# Patient Record
Sex: Female | Born: 1945 | Race: White | Hispanic: No | Smoking: Never smoker
Health system: Southern US, Community
[De-identification: ages and names within clinical notes are randomized; demographics above are authoritative.]

## PROBLEM LIST (undated history)

## (undated) ENCOUNTER — Inpatient Hospital Stay: Payer: Self-pay | Admitting: Diagnostic Radiology

## (undated) DIAGNOSIS — Z9289 Personal history of other medical treatment: Secondary | ICD-10-CM

## (undated) DIAGNOSIS — N281 Cyst of kidney, acquired: Secondary | ICD-10-CM

## (undated) DIAGNOSIS — H409 Unspecified glaucoma: Secondary | ICD-10-CM

## (undated) DIAGNOSIS — I10 Essential (primary) hypertension: Secondary | ICD-10-CM

## (undated) DIAGNOSIS — G4730 Sleep apnea, unspecified: Secondary | ICD-10-CM

## (undated) DIAGNOSIS — M4800 Spinal stenosis, site unspecified: Secondary | ICD-10-CM

## (undated) DIAGNOSIS — M722 Plantar fascial fibromatosis: Secondary | ICD-10-CM

## (undated) DIAGNOSIS — M797 Fibromyalgia: Secondary | ICD-10-CM

## (undated) DIAGNOSIS — M419 Scoliosis, unspecified: Secondary | ICD-10-CM

## (undated) DIAGNOSIS — E7800 Pure hypercholesterolemia, unspecified: Secondary | ICD-10-CM

## (undated) DIAGNOSIS — M1990 Unspecified osteoarthritis, unspecified site: Secondary | ICD-10-CM

## (undated) DIAGNOSIS — C801 Malignant (primary) neoplasm, unspecified: Secondary | ICD-10-CM

## (undated) HISTORY — PX: CHOLECYSTECTOMY: SHX55

## (undated) HISTORY — PX: CATARACT EXTRACTION: SUR2

## (undated) HISTORY — PX: FINGER SURGERY: SHX640

## (undated) HISTORY — PX: EYE SURGERY: SHX253

## (undated) HISTORY — PX: TUBAL LIGATION: SHX77

## (undated) HISTORY — PX: TONSILLECTOMY: SUR1361

## (undated) HISTORY — PX: BACK SURGERY: SHX140

## (undated) HISTORY — PX: ABDOMINAL HYSTERECTOMY: SHX81

## (undated) HISTORY — PX: ANKLE FRACTURE SURGERY: SHX122

---

## 2007-04-10 ENCOUNTER — Inpatient Hospital Stay (HOSPITAL_COMMUNITY): Admission: RE | Admit: 2007-04-10 | Discharge: 2007-04-14 | Payer: Self-pay | Admitting: Orthopaedic Surgery

## 2008-11-28 ENCOUNTER — Encounter: Admission: RE | Admit: 2008-11-28 | Discharge: 2008-11-28 | Payer: Self-pay | Admitting: Orthopaedic Surgery

## 2010-10-26 ENCOUNTER — Encounter
Admission: RE | Admit: 2010-10-26 | Discharge: 2010-10-26 | Payer: Self-pay | Source: Home / Self Care | Attending: Orthopaedic Surgery | Admitting: Orthopaedic Surgery

## 2010-11-08 HISTORY — PX: ANKLE FRACTURE SURGERY: SHX122

## 2010-11-13 ENCOUNTER — Inpatient Hospital Stay (HOSPITAL_COMMUNITY)
Admission: RE | Admit: 2010-11-13 | Discharge: 2010-11-16 | Payer: Self-pay | Source: Home / Self Care | Attending: Orthopaedic Surgery | Admitting: Orthopaedic Surgery

## 2010-11-23 LAB — BASIC METABOLIC PANEL
BUN: 7 mg/dL (ref 6–23)
CO2: 26 mEq/L (ref 19–32)
Calcium: 7.9 mg/dL — ABNORMAL LOW (ref 8.4–10.5)
Chloride: 103 mEq/L (ref 96–112)
Creatinine, Ser: 0.9 mg/dL (ref 0.4–1.2)
GFR calc Af Amer: >60 mL/min (ref >60)
GFR calc non Af Amer: >60 mL/min (ref >60)
Glucose, Bld: 159 mg/dL — ABNORMAL HIGH (ref 70–99)
Potassium: 4.2 mEq/L (ref 3.5–5.1)
Sodium: 135 mEq/L (ref 135–145)

## 2010-11-23 LAB — HEMOGLOBIN AND HEMATOCRIT, BLOOD
HCT: 25.3 % — ABNORMAL LOW (ref 36.0–46.0)
HCT: 25.1 % — ABNORMAL LOW (ref 36.0–46.0)
Hemoglobin: 8.4 g/dL — ABNORMAL LOW (ref 12.0–15.0)
Hemoglobin: 8.3 g/dL — ABNORMAL LOW (ref 12.0–15.0)

## 2011-01-18 LAB — DIFFERENTIAL
Basophils Absolute: 0 10*3/uL (ref 0.0–0.1)
Basophils Relative: 1 % (ref 0–1)
Eosinophils Absolute: 0.3 10*3/uL (ref 0.0–0.7)
Eosinophils Relative: 5 % (ref 0–5)
Lymphocytes Relative: 23 % (ref 12–46)
Lymphs Abs: 1.2 10*3/uL (ref 0.7–4.0)
Monocytes Absolute: 0.5 10*3/uL (ref 0.1–1.0)
Monocytes Relative: 10 % (ref 3–12)
Neutro Abs: 3.2 10*3/uL (ref 1.7–7.7)
Neutrophils Relative %: 61 % (ref 43–77)

## 2011-01-18 LAB — CBC
HCT: 39.1 % (ref 36.0–46.0)
Hemoglobin: 13 g/dL (ref 12.0–15.0)
MCH: 28 pg (ref 26.0–34.0)
MCHC: 33.2 g/dL (ref 30.0–36.0)
MCV: 84.1 fL (ref 78.0–100.0)
Platelets: 205 10*3/uL (ref 150–400)
RBC: 4.65 MIL/uL (ref 3.87–5.11)
RDW: 13.1 % (ref 11.5–15.5)
WBC: 5.2 10*3/uL (ref 4.0–10.5)

## 2011-01-18 LAB — COMPREHENSIVE METABOLIC PANEL
ALT: 15 U/L (ref 0–35)
AST: 25 U/L (ref 0–37)
Albumin: 3.7 g/dL (ref 3.5–5.2)
Alkaline Phosphatase: 69 U/L (ref 39–117)
BUN: 9 mg/dL (ref 6–23)
CO2: 29 mEq/L (ref 19–32)
Calcium: 9.5 mg/dL (ref 8.4–10.5)
Chloride: 103 mEq/L (ref 96–112)
Creatinine, Ser: 0.94 mg/dL (ref 0.4–1.2)
GFR calc Af Amer: >60 mL/min (ref >60)
GFR calc non Af Amer: 60 mL/min — ABNORMAL LOW (ref >60)
Glucose, Bld: 80 mg/dL (ref 70–99)
Potassium: 3.7 mEq/L (ref 3.5–5.1)
Sodium: 139 mEq/L (ref 135–145)
Total Bilirubin: 0.7 mg/dL (ref 0.3–1.2)
Total Protein: 6.3 g/dL (ref 6.0–8.3)

## 2011-01-18 LAB — URINALYSIS, ROUTINE W REFLEX MICROSCOPIC
Bilirubin Urine: NEGATIVE
Glucose, UA: NEGATIVE mg/dL
Hgb urine dipstick: NEGATIVE
Ketones, ur: NEGATIVE mg/dL
Nitrite: NEGATIVE
Protein, ur: NEGATIVE mg/dL
Specific Gravity, Urine: 1.008 (ref 1.005–1.030)
Urobilinogen, UA: 0.2 mg/dL (ref 0.0–1.0)
pH: 6 (ref 5.0–8.0)

## 2011-01-18 LAB — SURGICAL PCR SCREEN
MRSA, PCR: NEGATIVE
Staphylococcus aureus: NEGATIVE

## 2011-01-18 LAB — TYPE AND SCREEN
ABO/RH(D): O POS
Antibody Screen: NEGATIVE

## 2011-01-18 LAB — PROTIME-INR
INR: 0.92 (ref 0.00–1.49)
Prothrombin Time: 12.6 seconds (ref 11.6–15.2)

## 2011-01-18 LAB — URINE MICROSCOPIC-ADD ON

## 2011-03-23 NOTE — Op Note (Signed)
Joanna Castro, Joanna Castro              ACCOUNT NO.:  [id number]   MEDICAL RECORD NO.:  [id number]          PATIENT TYPE:  INP   LOCATION:  5021                         FACILITY:  [facility]   PHYSICIAN:  Mark C. Ophelia Charter, M.D.    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   PROCEDURE:  L4-5 decompression and 360 fusion.  PEEK cage packed with  autograft bone.  Bilateral gutter fusion with Vitoss plus local bone.  Right transverse lumbar interbody fusion with autograft interbody bone  plus cage.  Polaris [employer]omet pedicle instrumentation, 6.5 screws, 45 mm  rod.   SURGEON:  Annell Greening, MD   ASSISTANT:  Maud Deed, PA-C   ANESTHESIA:  GOT.   ESTIMATED BLOOD LOSS:  900 plus 300 mL Cell Saver retransfusion.   DRAINS:  One Hemovac subcu.   INDICATION:  This 65 year old female has had progressive back pain and  instability with MRI findings of moderate stenosis, anterolisthesis,  degenerative and single-level disease.  She is admitted for  decompression and stabilization for instability.   PROCEDURE:  After induction of general anesthesia, the patient was  placed prone on chest rolls with careful padding and positioning, arms  on arm boards, reversed Trendelenburg to take pressure off thighs.  A  time-out procedure was taken and the area was squared off with towels;  Betadine, Vi-Drape, sterile Steri-Drape and laminectomy drapes were  applied.  Localization was performed after incision was made in  subcutaneous tissue and a Kocher clamp was placed over the expected L4-5  disk space.  Cross-table lateral C-arm confirmed this, which was  sterilely draped and placed underneath the patient, which was on the  flat Murray table with her chest rolls.  Decompression was performed;  the operative  microscope was used.  Once decompression was performed  following out over the facets out to the transverse processes, all  transverse processes were identified.  The upper one on the right at L4  had the tip of it cracked off, which was the lateral one-third.  Pedicle  screws were placed after decompression removing the facets using  osteotomes and curettes.  Once all 4 pedicle screws were placed,  checking AP and lateral fluoroscopy with good position, the diskectomy  was performed on the right side with removal using box cutters, the  chisels, up and down curettes, Epstein shaved curettes, straight  curettes, small, medium and large pituitaries, straight and upbiting.  Large pieces of bone from the facet that had been removed was packed  after being custom-fashioned and impacted in the anterior portion of the  disk space anterior 1/3, next, trial sizers of 7- and 9-mm trials with  good fit with the 9.  Nine-millimeter cage was packed with chunked-up  pieces of local bone impacted into position.  The cage was slightly  vertical and did not completely shift over, but was in the midline, but  did not completely rotate.  Distraction was used between the pedicle  screws and continued impaction and a cage was secured with 2-3 mm  underneath the endplates.  The anterolisthesis had been reduced, but the  cage would not advance any further due to the large chunks of the  autograft bone that was anterior to it.  Since the cage was secure, it  could not be pulled back and it was left in that position.  Decortications on the transverse processes were performed, rods were  placed, tightened down on the right TLIF side first over the cage and  then on the opposite side, compressing.  Final spot fluoro pictures were  taken and bone, which was autograft and small pieces placed and rolled  up in the Vitoss like a Taco, was placed down the transverse process and  additional pieces of small local bone  were packed over the top.  No bone  was left in the canal.  During the procedure, some epidural veins were  coagulated.  There was more blood loss from the endplates after using  the box chisel and curettes then usual.  Some FloSeal as well as  thrombin-soaked Gelfoam was used intermittently for this.  Blood loss  was 900 mL with retransfusion of 300 mL.  Final spot pictures showed  good position.  Fascia was closed after copious irrigation with 0  Vicryl, 2-0 Vicryl in subcutaneous tissue, skin staple closure, Marcaine  infiltration in the skin and postop dressing.  Instrument count and  needle count was correct.      Mark C. Ophelia Charter, M.D.  Electronically Signed     MCY/MEDQ  D:  04/10/2007  T:  04/11/2007  Job:  [id number]

## 2011-08-26 LAB — BASIC METABOLIC PANEL
BUN: 18
CO2: 27
Calcium: 7.4 — ABNORMAL LOW
Chloride: 106
Creatinine, Ser: 1.11
GFR calc Af Amer: >60
GFR calc non Af Amer: 50 — ABNORMAL LOW
Glucose, Bld: 120 — ABNORMAL HIGH
Potassium: 3.4 — ABNORMAL LOW
Sodium: 136

## 2011-08-26 LAB — HEMOGLOBIN AND HEMATOCRIT, BLOOD
HCT: 29.3 — ABNORMAL LOW
HCT: 31.3 — ABNORMAL LOW
Hemoglobin: 9.9 — ABNORMAL LOW

## 2011-08-26 LAB — PREPARE RBC (CROSSMATCH)

## 2012-11-20 ENCOUNTER — Emergency Department (HOSPITAL_BASED_OUTPATIENT_CLINIC_OR_DEPARTMENT_OTHER): Payer: Medicare Other

## 2012-11-20 ENCOUNTER — Emergency Department (HOSPITAL_BASED_OUTPATIENT_CLINIC_OR_DEPARTMENT_OTHER)
Admission: EM | Admit: 2012-11-20 | Discharge: 2012-11-20 | Disposition: A | Discharge status: Home / Self Care | Payer: Medicare Other | Attending: Emergency Medicine | Admitting: Emergency Medicine

## 2012-11-20 ENCOUNTER — Encounter (HOSPITAL_BASED_OUTPATIENT_CLINIC_OR_DEPARTMENT_OTHER): Payer: Self-pay | Admitting: Emergency Medicine

## 2012-11-20 DIAGNOSIS — W19XXXA Unspecified fall, initial encounter: Secondary | ICD-10-CM

## 2012-11-20 DIAGNOSIS — S0101XA Laceration without foreign body of scalp, initial encounter: Secondary | ICD-10-CM

## 2012-11-20 DIAGNOSIS — Y9289 Other specified places as the place of occurrence of the external cause: Secondary | ICD-10-CM | POA: Insufficient documentation

## 2012-11-20 DIAGNOSIS — W010XXA Fall on same level from slipping, tripping and stumbling without subsequent striking against object, initial encounter: Secondary | ICD-10-CM | POA: Insufficient documentation

## 2012-11-20 DIAGNOSIS — Y9301 Activity, walking, marching and hiking: Secondary | ICD-10-CM | POA: Insufficient documentation

## 2012-11-20 DIAGNOSIS — S0100XA Unspecified open wound of scalp, initial encounter: Secondary | ICD-10-CM | POA: Insufficient documentation

## 2012-11-20 HISTORY — DX: Unspecified glaucoma: H40.9

## 2012-11-20 HISTORY — DX: Essential (primary) hypertension: I10

## 2012-11-20 HISTORY — DX: Spinal stenosis, site unspecified: M48.00

## 2012-11-20 HISTORY — DX: Scoliosis, unspecified: M41.9

## 2012-11-20 HISTORY — DX: Pure hypercholesterolemia, unspecified: E78.00

## 2012-11-20 HISTORY — DX: Unspecified osteoarthritis, unspecified site: M19.90

## 2012-11-20 MED ORDER — LIDOCAINE-EPINEPHRINE 2 %-1:100000 IJ SOLN
INTRAMUSCULAR | Status: AC
  Filled 2012-11-20: qty 1

## 2012-11-20 NOTE — ED Notes (Signed)
MD at bedside. 

## 2012-11-20 NOTE — ED Provider Notes (Signed)
History     CSN: [id number]  Arrival date & time 11/20/12  1652   First MD Initiated Contact with Patient 11/20/12 1704      Chief Complaint  Patient presents with  . Fall  . Head Injury    (Consider location/radiation/quality/duration/timing/severity/associated sxs/prior treatment) HPI Comments: Patient had a mechanical fall on the parking lot. Patient tripped over her cane, which she uses for some chronic leg pain. She reports no chest pain, shortness of breath, dizziness, nausea or vomiting prior to the fall. She hit her head and it bounced 3 times a concrete. No loss of consciousness. Ambulatory after the event. She is not on any anticoagulants, however takes NSAIDs daily  Patient is a 67 y.o. female presenting with fall and head injury. The history is provided by the patient.  Fall The accident occurred less than 1 hour ago. The fall occurred while walking. She fell from a height of 3 to 5 ft. She landed on concrete. The volume of blood lost was minimal. The point of impact was the head. The pain is present in the head (Posterior pelvis). The pain is at a severity of 3/10. The pain is mild. She was ambulatory at the scene. There was no drug use involved in the accident. There was no alcohol use involved in the accident. Pertinent negatives include no fever, no abdominal pain, no vomiting, no hearing loss and no loss of consciousness. She has tried nothing for the symptoms.  Head Injury  Pertinent negatives include no vomiting.    No past medical history on file.  Past Surgical History  Procedure Date  . Back surgery   . Ankle fracture surgery     No family history on file.  History  Substance Use Topics  . Smoking status: Not on file  . Smokeless tobacco: Not on file  . Alcohol Use:     OB History    Grav Para Term Preterm Abortions TAB SAB Ect Mult Living                  Review of Systems  Constitutional: Negative for fever and chills.  Respiratory: Negative  for cough and shortness of breath.   Gastrointestinal: Negative for vomiting and abdominal pain.  Neurological: Negative for loss of consciousness.  All other systems reviewed and are negative.    Allergies  Review of patient's allergies indicates no known allergies.  Home Medications  No current outpatient prescriptions on file.  BP 145/76  Pulse 88  Temp 98.6 F (37 C) (Oral)  Resp 16  Ht 5\' 5"  (1.651 m)  Wt 195 lb (88.451 kg)  BMI 32.45 kg/m2  SpO2 99%  Physical Exam  Nursing note and vitals reviewed. Constitutional: She is oriented to person, place, and time. She appears well-developed and well-nourished. No distress.  HENT:  Head: Normocephalic.    Eyes: EOM are normal. Pupils are equal, round, and reactive to light.  Neck: Normal range of motion. Neck supple.  Cardiovascular: Normal rate and regular rhythm.  Exam reveals no friction rub.   No murmur heard. Pulmonary/Chest: Effort normal and breath sounds normal. No respiratory distress. She has no wheezes. She has no rales.  Abdominal: Soft. She exhibits no distension. There is no tenderness. There is no rebound.  Musculoskeletal: Normal range of motion. She exhibits no edema.       Cervical back: She exhibits normal range of motion, no tenderness and no bony tenderness.       Lumbar back:  She exhibits bony tenderness (Sacral).       Back:  Neurological: She is alert and oriented to person, place, and time.  Skin: She is not diaphoretic.    ED Course  LACERATION REPAIR Date/Time: 11/20/2012 6:40 PM Performed by: Elwin Mocha Authorized by: Osvaldo Human Consent: Verbal consent obtained. Body area: head/neck Location details: scalp Laceration length: 3 cm Foreign bodies: no foreign bodies Tendon involvement: none Nerve involvement: none Vascular damage: no Anesthesia: local infiltration Local anesthetic: lidocaine 2% with epinephrine Anesthetic total: 4 ml Patient sedated: no Preparation:  Patient was prepped and draped in the usual sterile fashion. Irrigation solution: saline Irrigation method: jet lavage Amount of cleaning: standard Debridement: none Degree of undermining: none Skin closure: staples Number of sutures: 4 Technique: simple Approximation: close Approximation difficulty: simple Patient tolerance: Patient tolerated the procedure well with no immediate complications.   (including critical care time)  Labs Reviewed - No data to display Dg Lumbar Spine Complete  11/20/2012  *RADIOLOGY REPORT*  Clinical Data: Low back pain post fall, multiple prior back surgeries  LUMBAR SPINE - COMPLETE 4+ VIEW  Comparison: CT lumbar spine 10/26/2010, 11/28/2008  Findings: CT exams labeled with last independent lumbar vertebra designated L5. Osseous demineralization. Prior posterior fusion of L3-L5 with intact bilateral pedicle screws and bars. Disc space narrowing with endplate spur formation L2-L3. Vertebral body heights maintained without fracture or subluxation. Prior laminectomy L4. Broad-based dextroconvex thoracolumbar scoliosis. SI joints preserved. No definite bone destruction or spondylolysis identified.  IMPRESSION: Post L3-L5 posterior fusion. Degenerative disc disease changes L2-L3. Osseous demineralization with dextroconvex scoliosis. No definite acute bony abnormalities.   Original Report Authenticated By: Ulyses Southward, M.D.    Dg Sacrum/coccyx  11/20/2012  *RADIOLOGY REPORT*  Clinical Data: Low back pain post fall, multiple prior back surgeries  SACRUM AND COCCYX - 2+ VIEW  Comparison: None  Findings: Orthopedic hardware at L4-L5. SI joints symmetric and preserved. Bones are demineralized, limiting exam. Sacral foramina appear grossly symmetric. Angular deformity at the distal sacrum. Cortex is difficult to evaluate due to degree of osseous demineralization. This area is not well visualized on the scout images from the prior CT exams and no adequate prior radiographs of  this site are available. Cannot exclude a distal sacral fracture. No other potential bony abnormalities identified.  IMPRESSION: Cannot exclude a distal sacral fracture; if the patient has symptoms referable to this site recommend noncontrast CT pelvis for further evaluation.   Original Report Authenticated By: Ulyses Southward, M.D.    Ct Head Wo Contrast  11/20/2012  *RADIOLOGY REPORT*  Clinical Data: Fall striking posterior head on concrete, laceration, no loss of consciousness  CT HEAD WITHOUT CONTRAST  Technique:  Contiguous axial images were obtained from the base of the skull through the vertex without contrast.  Comparison: None  Findings: Generalized atrophy. Normal ventricular morphology. No midline shift or mass effect. Minimal periventricular white matter lucency question small vessel chronic ischemic change. No intracranial hemorrhage, mass lesion or evidence of acute infarction. No extra-axial fluid collections. Small scalp hematoma at posterior vertex. Bones and sinuses unremarkable.  IMPRESSION: Atrophy with minimal small vessel chronic ischemic changes of periventricular white matter. No acute intracranial abnormalities.   Original Report Authenticated By: Ulyses Southward, M.D.      1. Fall   2. Scalp laceration       MDM  67 year old female with history of multiple spinal surgeries presents after a mechanical fall. She tripped over her cane in the parking lot. Denies any  preceding symptoms. Her head bounced 3 times in the concrete. She is a midthoracic of the event and denies any loss of consciousness. There is a small lac on her posterior scalp. She has mild sacral tenderness, she states is chronic, however worse after the fall. She has no extremity deformity. She is ambulatory and can walk well. She has no pain with ROM of her hips.  I will scan her Head. I do not feel like she requires a CT of her pelvis, however I will get xrays of her L spine and sacrum/coccyx. CT of her head is negative.  X-rays of her whole spine and coccyx are normal except they could not rule out distal coccygeal injury. I discussed this with patient is comfortable with not pursuing CT scan. She is ambulatory around the room with her cane, as she is at baseline. Lac repair as above. Stable for discharge.        Elwin Mocha, MD 11/21/12 [phone]

## 2012-11-20 NOTE — ED Notes (Signed)
Pt to room 10 by ems, pt able to stand and walk to stretcher, reports trip and fall when she was walking across the parking lot of her doctor's office. Hematoma and small amt of blood noted to posterior scalp, no active bleeding noted.

## 2012-11-21 NOTE — ED Provider Notes (Signed)
I reviewed the resident's note and I agree with the findings and plan.   Carleene Cooper III, MD 11/21/12 [phone]

## 2014-04-08 ENCOUNTER — Other Ambulatory Visit (HOSPITAL_COMMUNITY): Payer: Self-pay | Admitting: Orthopaedic Surgery

## 2014-04-18 ENCOUNTER — Encounter (HOSPITAL_COMMUNITY): Payer: Self-pay | Admitting: Pharmacy Technician

## 2014-04-22 ENCOUNTER — Encounter (HOSPITAL_COMMUNITY): Payer: Self-pay

## 2014-04-22 ENCOUNTER — Encounter (HOSPITAL_COMMUNITY)
Admission: RE | Admit: 2014-04-22 | Discharge: 2014-04-22 | Disposition: A | Discharge status: Home / Self Care | Payer: Medicare HMO | Source: Ambulatory Visit | Attending: Orthopaedic Surgery | Admitting: Orthopaedic Surgery

## 2014-04-22 ENCOUNTER — Ambulatory Visit (HOSPITAL_COMMUNITY)
Admission: RE | Admit: 2014-04-22 | Discharge: 2014-04-22 | Disposition: A | Discharge status: Home / Self Care | Payer: Medicare HMO | Source: Ambulatory Visit | Attending: Orthopaedic Surgery | Admitting: Orthopaedic Surgery

## 2014-04-22 DIAGNOSIS — Z01818 Encounter for other preprocedural examination: Secondary | ICD-10-CM | POA: Insufficient documentation

## 2014-04-22 DIAGNOSIS — Z01812 Encounter for preprocedural laboratory examination: Secondary | ICD-10-CM | POA: Insufficient documentation

## 2014-04-22 HISTORY — DX: Sleep apnea, unspecified: G47.30

## 2014-04-22 HISTORY — DX: Personal history of other medical treatment: Z92.89

## 2014-04-22 HISTORY — DX: Cyst of kidney, acquired: N28.1

## 2014-04-22 LAB — URINE MICROSCOPIC-ADD ON

## 2014-04-22 LAB — CBC
HEMATOCRIT: 37.4 % (ref 36.0–46.0)
HEMOGLOBIN: 12.1 g/dL (ref 12.0–15.0)
MCH: 27.2 pg (ref 26.0–34.0)
MCHC: 32.4 g/dL (ref 30.0–36.0)
MCV: 84 fL (ref 78.0–100.0)
Platelets: 341 10*3/uL (ref 150–400)
RBC: 4.45 MIL/uL (ref 3.87–5.11)
RDW: 13.1 % (ref 11.5–15.5)
WBC: 8.7 10*3/uL (ref 4.0–10.5)

## 2014-04-22 LAB — PROTIME-INR
INR: 1.06 (ref 0.00–1.49)
PROTHROMBIN TIME: 13.6 s (ref 11.6–15.2)

## 2014-04-22 LAB — COMPREHENSIVE METABOLIC PANEL
ALBUMIN: 3.8 g/dL (ref 3.5–5.2)
ALK PHOS: 87 U/L (ref 39–117)
ALT: 15 U/L (ref 0–35)
AST: 22 U/L (ref 0–37)
BILIRUBIN TOTAL: 0.4 mg/dL (ref 0.3–1.2)
BUN: 15 mg/dL (ref 6–23)
CHLORIDE: 105 meq/L (ref 96–112)
CO2: 25 mEq/L (ref 19–32)
CREATININE: 0.91 mg/dL (ref 0.50–1.10)
Calcium: 9.7 mg/dL (ref 8.4–10.5)
GFR calc non Af Amer: 63 mL/min — ABNORMAL LOW (ref >90)
GFR, EST AFRICAN AMERICAN: 73 mL/min — AB (ref >90)
GLUCOSE: 98 mg/dL (ref 70–99)
POTASSIUM: 4 meq/L (ref 3.7–5.3)
Sodium: 142 mEq/L (ref 137–147)
Total Protein: 7.1 g/dL (ref 6.0–8.3)

## 2014-04-22 LAB — URINALYSIS, ROUTINE W REFLEX MICROSCOPIC
BILIRUBIN URINE: NEGATIVE
Glucose, UA: NEGATIVE mg/dL
HGB URINE DIPSTICK: NEGATIVE
Ketones, ur: NEGATIVE mg/dL
NITRITE: NEGATIVE
PROTEIN: NEGATIVE mg/dL
SPECIFIC GRAVITY, URINE: 1.019 (ref 1.005–1.030)
UROBILINOGEN UA: 1 mg/dL (ref 0.0–1.0)
pH: 6 (ref 5.0–8.0)

## 2014-04-22 LAB — SURGICAL PCR SCREEN
MRSA, PCR: NEGATIVE
Staphylococcus aureus: NEGATIVE

## 2014-04-22 LAB — TYPE AND SCREEN
ABO/RH(D): O POS
Antibody Screen: NEGATIVE

## 2014-04-22 NOTE — Pre-Procedure Instructions (Signed)
Joanna Castro  04/22/2014   Your procedure is scheduled on:  Monday, June 22nd  [facility] at 1030 AM.  Call this number if you have problems the morning of surgery: [phone]   Remember:   Do not eat food or drink liquids after midnight.   Take these medicines the morning of surgery with A SIP OF WATER: neurontin, norvasc, ultram if needed, eye drops, zyrtec  Stop taking OTC vitamins, NSAIDS (ibuprofen, naproxen) as of today   Do not wear jewelry, make-up or nail polish.  Do not wear lotions, powders, or perfumes. You may wear deodorant.  Do not shave 48 hours prior to surgery. Men may shave face and neck.  Do not bring valuables to the hospital.  [facility] is not responsible  for any belongings or valuables.               Contacts, dentures or bridgework may not be worn into surgery.  Leave suitcase in the car. After surgery it may be brought to your room.  For patients admitted to the hospital, discharge time is determined by your  treatment team.             Please read over the following fact sheets that you were given: Pain Booklet, Coughing and Deep Breathing, Blood Transfusion Information, MRSA Information and Surgical Site Infection Prevention [place] - Preparing for Surgery  Before surgery, you can play an important role.  Because skin is not sterile, your skin needs to be as free of germs as possible.  You can reduce the number of germs on you skin by washing with CHG (chlorahexidine gluconate) soap before surgery.  CHG is an antiseptic cleaner which kills germs and bonds with the skin to continue killing germs even after washing.  Please DO NOT use if you have an allergy to CHG or antibacterial soaps.  If your skin becomes reddened/irritated stop using the CHG and inform your nurse when you arrive at Short Stay.  Do not shave (including legs and underarms) for at least 48 hours prior to the first CHG shower.  You may shave your  face.  Please follow these instructions carefully:   1.  Shower with CHG Soap the night before surgery and the morning of Surgery.  2.  If you choose to wash your hair, wash your hair first as usual with your normal shampoo.  3.  After you shampoo, rinse your hair and body thoroughly to remove the shampoo.  4.  Use CHG as you would any other liquid soap.  You can apply CHG directly to the skin and wash gently with scrungie or a clean washcloth.  5.  Apply the CHG Soap to your body ONLY FROM THE NECK DOWN.  Do not use on open wounds or open sores.  Avoid contact with your eyes, ears, mouth and genitals (private parts).  Wash genitals (private parts) with your normal soap.  6.  Wash thoroughly, paying special attention to the area where your surgery will be performed.  7.  Thoroughly rinse your body with warm water from the neck down.  8.  DO NOT shower/wash with your normal soap after using and rinsing off the CHG Soap.  9.  Pat yourself dry with a clean towel.            10.  Wear clean pajamas.            11.  Place clean sheets on your  bed the night of your first shower and do not sleep with pets.  Day of Surgery  Do not apply any lotions/deoderants the morning of surgery.  Please wear clean clothes to the hospital/surgery center.

## 2014-04-22 NOTE — Progress Notes (Signed)
Primary - dr Bing [facility] ([facility]) [facility] cardi[facility] - will request

## 2014-04-26 NOTE — H&P (Signed)
TOTAL KNEE ADMISSION H&P  Patient is being admitted for right total knee arthroplasty.  Subjective:  Chief Complaint:right knee pain.  HPI: Joanna Castro, 68 y.o. female, has a history of pain and functional disability in the right knee due to arthritis and has failed non-surgical conservative treatments for greater than 12 weeks to includeNSAID's and/or analgesics, corticosteriod injections and use of assistive devices.  Onset of symptoms was gradual, starting 3 years ago with gradually worsening course since that time. The patient noted no past surgery on the right knee(s).  Patient currently rates pain in the right knee(s) at 6 out of 10 with activity. Patient has worsening of pain with activity and weight bearing, pain that interferes with activities of daily living and joint swelling.  Patient has evidence of subchondral cysts, subchondral sclerosis, periarticular osteophytes and joint space narrowing by imaging studies.  There is no active infection.  There are no active problems to display for this patient.  Past Medical History  Diagnosis Date  . Spinal stenosis   . Scoliosis   . Hypertension   . Arthritis   . High cholesterol   . Glaucoma   . Sleep apnea     intermittently uses cpap ( started 2 weeks ago)  . Kidney cysts     effecting creat.  Marland Kitchen. Headache(784.0)   . History of blood transfusion     Past Surgical History  Procedure Laterality Date  . Back surgery    . Ankle fracture surgery    . Cholecystectomy    . Abdominal hysterectomy    . Finger surgery    . Eye surgery Bilateral     placed duct tubes    No prescriptions prior to admission   No Known Allergies  History  Substance Use Topics  . Smoking status: Never Smoker   . Smokeless tobacco: Not on file  . Alcohol Use: Yes     Comment: rare    No family history on file.   Review of Systems  Musculoskeletal: Positive for joint pain.       Right knee pain and dysfunction    Objective:  Physical  Exam  Constitutional: She is oriented to person, place, and time. She appears well-developed and well-nourished.  HENT:  Head: Normocephalic and atraumatic.  Eyes: EOM are normal. Pupils are equal, round, and reactive to light.  Neck: Normal range of motion.  Cardiovascular: Normal rate.   Respiratory: Effort normal.  GI: Soft.  Musculoskeletal:  ROM 0-110 degrees.  Valgus deformity.  Lateral joint line tenderness.  Neurological: She is alert and oriented to person, place, and time.  Skin: Skin is warm and dry.  Psychiatric: She has a normal mood and affect.    Vital signs in last 24 hours:    Labs:   Estimated body mass index is 32.45 kg/(m^2) as calculated from the following:   Height as of 11/20/12: 5\' 5"  (1.651 m).   Weight as of 11/20/12: 88.451 kg (195 lb).   Imaging Review Plain radiographs demonstrate moderate degenerative joint disease of the right knee(s). The overall alignment ismild valgus. The bone quality appears to be adequate for age and reported activity level.  Assessment/Plan:  End stage arthritis, right knee   The patient history, physical examination, clinical judgment of the provider and imaging studies are consistent with end stage degenerative joint disease of the right knee(s) and total knee arthroplasty is deemed medically necessary. The treatment options including medical management, injection therapy arthroscopy and arthroplasty were discussed  at length. The risks and benefits of total knee arthroplasty were presented and reviewed. The risks due to aseptic loosening, infection, stiffness, patella tracking problems, thromboembolic complications and other imponderables were discussed. The patient acknowledged the explanation, agreed to proceed with the plan and consent was signed. Patient is being admitted for inpatient treatment for surgery, pain control, PT, OT, prophylactic antibiotics, VTE prophylaxis, progressive ambulation and ADL's and discharge  planning. The patient is planning to be discharged home with home health services

## 2014-04-28 MED ORDER — CEFAZOLIN SODIUM-DEXTROSE 2-3 GM-% IV SOLR
2.0000 g | INTRAVENOUS | Status: AC
  Administered 2014-04-29: 2 g via INTRAVENOUS
  Filled 2014-04-28: qty 50

## 2014-04-29 ENCOUNTER — Encounter (HOSPITAL_COMMUNITY): Payer: Medicare HMO | Admitting: Anesthesiology

## 2014-04-29 ENCOUNTER — Encounter (HOSPITAL_COMMUNITY)
Admission: RE | Disposition: A | Discharge status: Home Health | Payer: Self-pay | Source: Ambulatory Visit | Attending: Orthopaedic Surgery

## 2014-04-29 ENCOUNTER — Inpatient Hospital Stay (HOSPITAL_COMMUNITY): Payer: Medicare HMO | Admitting: Anesthesiology

## 2014-04-29 ENCOUNTER — Inpatient Hospital Stay (HOSPITAL_COMMUNITY)
Admission: RE | Admit: 2014-04-29 | Discharge: 2014-05-01 | DRG: 470 | Disposition: A | Discharge status: Home Health | Payer: Medicare HMO | Source: Ambulatory Visit | Attending: Orthopaedic Surgery | Admitting: Orthopaedic Surgery

## 2014-04-29 ENCOUNTER — Encounter (HOSPITAL_COMMUNITY): Payer: Self-pay | Admitting: Anesthesiology

## 2014-04-29 DIAGNOSIS — M1711 Unilateral primary osteoarthritis, right knee: Secondary | ICD-10-CM | POA: Diagnosis present

## 2014-04-29 DIAGNOSIS — M1710 Unilateral primary osteoarthritis, unspecified knee: Principal | ICD-10-CM | POA: Diagnosis present

## 2014-04-29 DIAGNOSIS — M4120 Other idiopathic scoliosis, site unspecified: Secondary | ICD-10-CM | POA: Diagnosis present

## 2014-04-29 DIAGNOSIS — E7800 Pure hypercholesterolemia, unspecified: Secondary | ICD-10-CM | POA: Diagnosis present

## 2014-04-29 DIAGNOSIS — I10 Essential (primary) hypertension: Secondary | ICD-10-CM | POA: Diagnosis present

## 2014-04-29 DIAGNOSIS — G4730 Sleep apnea, unspecified: Secondary | ICD-10-CM | POA: Diagnosis present

## 2014-04-29 HISTORY — PX: KNEE ARTHROPLASTY: SHX992

## 2014-04-29 SURGERY — ARTHROPLASTY, KNEE, TOTAL, USING IMAGELESS COMPUTER-ASSISTED NAVIGATION
Anesthesia: Regional | Site: Knee | Laterality: Right

## 2014-04-29 MED ORDER — TIMOLOL MALEATE 0.5 % OP SOLN
1.0000 [drp] | Freq: Every day | OPHTHALMIC | Status: DC
  Filled 2014-04-29 (×2): qty 5

## 2014-04-29 MED ORDER — FENTANYL CITRATE 0.05 MG/ML IJ SOLN
INTRAMUSCULAR | Status: AC
  Filled 2014-04-29: qty 5

## 2014-04-29 MED ORDER — PHENYLEPHRINE 40 MCG/ML (10ML) SYRINGE FOR IV PUSH (FOR BLOOD PRESSURE SUPPORT)
PREFILLED_SYRINGE | INTRAVENOUS | Status: AC
  Filled 2014-04-29: qty 10

## 2014-04-29 MED ORDER — PHENYLEPHRINE HCL 10 MG/ML IJ SOLN
INTRAMUSCULAR | Status: AC
  Filled 2014-04-29: qty 1

## 2014-04-29 MED ORDER — HYDROMORPHONE HCL PF 1 MG/ML IJ SOLN
INTRAMUSCULAR | Status: AC
  Filled 2014-04-29: qty 2

## 2014-04-29 MED ORDER — METHOCARBAMOL 500 MG PO TABS: 500.0000 mg | ORAL_TABLET | Freq: Four times a day (QID) | ORAL | Status: DC | PRN

## 2014-04-29 MED ORDER — ROCURONIUM BROMIDE 50 MG/5ML IV SOLN
INTRAVENOUS | Status: AC
  Filled 2014-04-29: qty 1

## 2014-04-29 MED ORDER — LACTATED RINGERS IV SOLN
INTRAVENOUS | Status: DC
  Administered 2014-04-29: 10:00:00 via INTRAVENOUS

## 2014-04-29 MED ORDER — ONDANSETRON HCL 4 MG/2ML IJ SOLN: 4.0000 mg | Freq: Four times a day (QID) | INTRAMUSCULAR | Status: DC | PRN

## 2014-04-29 MED ORDER — GABAPENTIN 300 MG PO CAPS
600.0000 mg | ORAL_CAPSULE | Freq: Two times a day (BID) | ORAL | Status: DC
  Administered 2014-04-29 – 2014-05-01 (×4): 600 mg via ORAL
  Filled 2014-04-29 (×5): qty 2

## 2014-04-29 MED ORDER — METHOCARBAMOL 500 MG PO TABS
500.0000 mg | ORAL_TABLET | Freq: Four times a day (QID) | ORAL | Status: DC | PRN
  Administered 2014-04-29 – 2014-05-01 (×6): 500 mg via ORAL
  Filled 2014-04-29 (×5): qty 1

## 2014-04-29 MED ORDER — METOCLOPRAMIDE HCL 10 MG PO TABS: 5.0000 mg | ORAL_TABLET | Freq: Three times a day (TID) | ORAL | Status: DC | PRN

## 2014-04-29 MED ORDER — PROPOFOL 10 MG/ML IV BOLUS
INTRAVENOUS | Status: AC
  Filled 2014-04-29: qty 20

## 2014-04-29 MED ORDER — ONDANSETRON HCL 4 MG/2ML IJ SOLN
INTRAMUSCULAR | Status: AC
  Filled 2014-04-29: qty 2

## 2014-04-29 MED ORDER — LORATADINE 10 MG PO TABS
10.0000 mg | ORAL_TABLET | Freq: Every day | ORAL | Status: DC
  Administered 2014-04-30 – 2014-05-01 (×2): 10 mg via ORAL
  Filled 2014-04-29 (×2): qty 1

## 2014-04-29 MED ORDER — LACTATED RINGERS IV SOLN
INTRAVENOUS | Status: DC | PRN
  Administered 2014-04-29 (×2): via INTRAVENOUS

## 2014-04-29 MED ORDER — NEOSTIGMINE METHYLSULFATE 10 MG/10ML IV SOLN
INTRAVENOUS | Status: AC
  Filled 2014-04-29: qty 1

## 2014-04-29 MED ORDER — HYDROMORPHONE HCL PF 1 MG/ML IJ SOLN
INTRAMUSCULAR | Status: AC
  Filled 2014-04-29: qty 1

## 2014-04-29 MED ORDER — OXYCODONE HCL 5 MG PO TABS
ORAL_TABLET | ORAL | Status: AC
  Filled 2014-04-29: qty 1

## 2014-04-29 MED ORDER — ACETAMINOPHEN 325 MG PO TABS: 650.0000 mg | ORAL_TABLET | Freq: Four times a day (QID) | ORAL | Status: DC | PRN

## 2014-04-29 MED ORDER — MIDAZOLAM HCL 2 MG/2ML IJ SOLN
INTRAMUSCULAR | Status: DC | PRN
  Administered 2014-04-29: 2 mg via INTRAVENOUS

## 2014-04-29 MED ORDER — LIDOCAINE HCL (CARDIAC) 20 MG/ML IV SOLN
INTRAVENOUS | Status: AC
  Filled 2014-04-29: qty 5

## 2014-04-29 MED ORDER — OXYCODONE-ACETAMINOPHEN 5-325 MG PO TABS: 1.0000 | ORAL_TABLET | ORAL | Status: DC | PRN

## 2014-04-29 MED ORDER — BISACODYL 10 MG RE SUPP: 10.0000 mg | Freq: Every day | RECTAL | Status: DC | PRN

## 2014-04-29 MED ORDER — GLYCOPYRROLATE 0.2 MG/ML IJ SOLN
INTRAMUSCULAR | Status: AC
  Filled 2014-04-29: qty 2

## 2014-04-29 MED ORDER — FENTANYL CITRATE 0.05 MG/ML IJ SOLN
INTRAMUSCULAR | Status: AC
  Administered 2014-04-29: 100 ug via INTRAVENOUS
  Filled 2014-04-29: qty 2

## 2014-04-29 MED ORDER — HYDROMORPHONE HCL PF 1 MG/ML IJ SOLN
INTRAMUSCULAR | Status: DC | PRN
  Administered 2014-04-29: 1 mg via INTRAVENOUS

## 2014-04-29 MED ORDER — MORPHINE SULFATE 2 MG/ML IJ SOLN
2.0000 mg | INTRAMUSCULAR | Status: DC | PRN
  Administered 2014-04-29: 2 mg via INTRAVENOUS
  Filled 2014-04-29: qty 1

## 2014-04-29 MED ORDER — PHENOL 1.4 % MT LIQD: 1.0000 | OROMUCOSAL | Status: DC | PRN

## 2014-04-29 MED ORDER — ROCURONIUM BROMIDE 100 MG/10ML IV SOLN
INTRAVENOUS | Status: DC | PRN
  Administered 2014-04-29: 40 mg via INTRAVENOUS

## 2014-04-29 MED ORDER — KCL IN DEXTROSE-NACL 20-5-0.45 MEQ/L-%-% IV SOLN
INTRAVENOUS | Status: AC
  Administered 2014-04-29: 1000 mL
  Filled 2014-04-29: qty 1000

## 2014-04-29 MED ORDER — SUCCINYLCHOLINE CHLORIDE 20 MG/ML IJ SOLN
INTRAMUSCULAR | Status: AC
  Filled 2014-04-29: qty 1

## 2014-04-29 MED ORDER — FLEET ENEMA 7-19 GM/118ML RE ENEM: 1.0000 | ENEMA | Freq: Once | RECTAL | Status: AC | PRN

## 2014-04-29 MED ORDER — ADULT MULTIVITAMIN W/MINERALS CH
1.0000 | ORAL_TABLET | Freq: Every day | ORAL | Status: DC
  Administered 2014-04-30 – 2014-05-01 (×2): 1 via ORAL
  Filled 2014-04-29 (×2): qty 1

## 2014-04-29 MED ORDER — CHLORHEXIDINE GLUCONATE 4 % EX LIQD: 60.0000 mL | Freq: Once | CUTANEOUS | Status: DC

## 2014-04-29 MED ORDER — HYDROMORPHONE HCL PF 1 MG/ML IJ SOLN
0.2500 mg | INTRAMUSCULAR | Status: DC | PRN
  Administered 2014-04-29 (×4): 0.5 mg via INTRAVENOUS

## 2014-04-29 MED ORDER — METOCLOPRAMIDE HCL 5 MG/ML IJ SOLN: 5.0000 mg | Freq: Three times a day (TID) | INTRAMUSCULAR | Status: DC | PRN

## 2014-04-29 MED ORDER — GLYCOPYRROLATE 0.2 MG/ML IJ SOLN
INTRAMUSCULAR | Status: DC | PRN
  Administered 2014-04-29: 0.6 mg via INTRAVENOUS

## 2014-04-29 MED ORDER — OXYCODONE HCL 5 MG PO TABS
5.0000 mg | ORAL_TABLET | Freq: Once | ORAL | Status: AC | PRN
  Administered 2014-04-29: 5 mg via ORAL

## 2014-04-29 MED ORDER — BUPIVACAINE-EPINEPHRINE (PF) 0.5% -1:200000 IJ SOLN
INTRAMUSCULAR | Status: DC | PRN
  Administered 2014-04-29: 30 mL via PERINEURAL

## 2014-04-29 MED ORDER — KCL IN DEXTROSE-NACL 40-5-0.45 MEQ/L-%-% IV SOLN
INTRAVENOUS | Status: DC
  Administered 2014-04-30: 06:00:00 via INTRAVENOUS
  Filled 2014-04-29 (×5): qty 1000

## 2014-04-29 MED ORDER — MENTHOL 3 MG MT LOZG: 1.0000 | LOZENGE | OROMUCOSAL | Status: DC | PRN

## 2014-04-29 MED ORDER — FENTANYL CITRATE 0.05 MG/ML IJ SOLN
INTRAMUSCULAR | Status: DC | PRN
  Administered 2014-04-29: 50 ug via INTRAVENOUS
  Administered 2014-04-29: 150 ug via INTRAVENOUS
  Administered 2014-04-29: 50 ug via INTRAVENOUS

## 2014-04-29 MED ORDER — DIPHENHYDRAMINE HCL 12.5 MG/5ML PO ELIX: 12.5000 mg | ORAL_SOLUTION | ORAL | Status: DC | PRN

## 2014-04-29 MED ORDER — MIDAZOLAM HCL 2 MG/2ML IJ SOLN
INTRAMUSCULAR | Status: AC
  Filled 2014-04-29: qty 2

## 2014-04-29 MED ORDER — PROPOFOL 10 MG/ML IV BOLUS
INTRAVENOUS | Status: DC | PRN
  Administered 2014-04-29: 160 mg via INTRAVENOUS

## 2014-04-29 MED ORDER — PHENYLEPHRINE HCL 10 MG/ML IJ SOLN
10.0000 mg | INTRAVENOUS | Status: DC | PRN
  Administered 2014-04-29: 50 ug/min via INTRAVENOUS

## 2014-04-29 MED ORDER — OXYCODONE HCL 5 MG PO TABS
5.0000 mg | ORAL_TABLET | ORAL | Status: DC | PRN
  Administered 2014-04-29 – 2014-05-01 (×9): 10 mg via ORAL
  Filled 2014-04-29 (×10): qty 2

## 2014-04-29 MED ORDER — NEOSTIGMINE METHYLSULFATE 10 MG/10ML IV SOLN
INTRAVENOUS | Status: DC | PRN
  Administered 2014-04-29: 4 mg via INTRAVENOUS

## 2014-04-29 MED ORDER — LISINOPRIL 20 MG PO TABS
20.0000 mg | ORAL_TABLET | Freq: Every day | ORAL | Status: DC
  Filled 2014-04-29: qty 1

## 2014-04-29 MED ORDER — DOCUSATE SODIUM 100 MG PO CAPS
100.0000 mg | ORAL_CAPSULE | Freq: Two times a day (BID) | ORAL | Status: DC
  Administered 2014-04-29 – 2014-05-01 (×4): 100 mg via ORAL
  Filled 2014-04-29 (×5): qty 1

## 2014-04-29 MED ORDER — FENTANYL CITRATE 0.05 MG/ML IJ SOLN
50.0000 ug | INTRAMUSCULAR | Status: DC | PRN
  Administered 2014-04-29: 100 ug via INTRAVENOUS

## 2014-04-29 MED ORDER — MIDAZOLAM HCL 2 MG/2ML IJ SOLN
INTRAMUSCULAR | Status: AC
  Administered 2014-04-29: 2 mg via INTRAVENOUS
  Filled 2014-04-29: qty 2

## 2014-04-29 MED ORDER — METHOCARBAMOL 500 MG PO TABS
ORAL_TABLET | ORAL | Status: AC
  Filled 2014-04-29: qty 1

## 2014-04-29 MED ORDER — SENNOSIDES-DOCUSATE SODIUM 8.6-50 MG PO TABS: 1.0000 | ORAL_TABLET | Freq: Every evening | ORAL | Status: DC | PRN

## 2014-04-29 MED ORDER — KETOROLAC TROMETHAMINE 15 MG/ML IJ SOLN
30.0000 mg | Freq: Four times a day (QID) | INTRAMUSCULAR | Status: DC
  Administered 2014-04-29 – 2014-04-30 (×3): 30 mg via INTRAVENOUS
  Filled 2014-04-29 (×4): qty 2

## 2014-04-29 MED ORDER — KETOROLAC TROMETHAMINE 30 MG/ML IJ SOLN
INTRAMUSCULAR | Status: DC | PRN
  Administered 2014-04-29: 30 mg via INTRAVENOUS

## 2014-04-29 MED ORDER — ACETAMINOPHEN 650 MG RE SUPP: 650.0000 mg | Freq: Four times a day (QID) | RECTAL | Status: DC | PRN

## 2014-04-29 MED ORDER — HYDROCHLOROTHIAZIDE 25 MG PO TABS
25.0000 mg | ORAL_TABLET | Freq: Every day | ORAL | Status: DC
  Filled 2014-04-29: qty 1

## 2014-04-29 MED ORDER — ONDANSETRON HCL 4 MG PO TABS: 4.0000 mg | ORAL_TABLET | Freq: Four times a day (QID) | ORAL | Status: DC | PRN

## 2014-04-29 MED ORDER — MULTI-VITAMIN/MINERALS PO TABS: 1.0000 | ORAL_TABLET | Freq: Every day | ORAL | Status: DC

## 2014-04-29 MED ORDER — BUPIVACAINE HCL (PF) 0.25 % IJ SOLN
INTRAMUSCULAR | Status: AC
  Filled 2014-04-29: qty 30

## 2014-04-29 MED ORDER — MIDAZOLAM HCL 2 MG/2ML IJ SOLN
1.0000 mg | INTRAMUSCULAR | Status: DC | PRN
  Administered 2014-04-29: 2 mg via INTRAVENOUS

## 2014-04-29 MED ORDER — LIDOCAINE HCL (CARDIAC) 20 MG/ML IV SOLN
INTRAVENOUS | Status: DC | PRN
  Administered 2014-04-29: 70 mg via INTRAVENOUS

## 2014-04-29 MED ORDER — SUCCINYLCHOLINE CHLORIDE 20 MG/ML IJ SOLN
INTRAMUSCULAR | Status: DC | PRN
  Administered 2014-04-29: 100 mg via INTRAVENOUS

## 2014-04-29 MED ORDER — OXYCODONE HCL 5 MG/5ML PO SOLN: 5.0000 mg | Freq: Once | ORAL | Status: AC | PRN

## 2014-04-29 MED ORDER — ASPIRIN EC 325 MG PO TBEC
325.0000 mg | DELAYED_RELEASE_TABLET | Freq: Every day | ORAL | Status: DC
  Administered 2014-04-30 – 2014-05-01 (×2): 325 mg via ORAL
  Filled 2014-04-29 (×3): qty 1

## 2014-04-29 MED ORDER — SIMVASTATIN 10 MG PO TABS
10.0000 mg | ORAL_TABLET | Freq: Every day | ORAL | Status: DC
  Administered 2014-04-29 – 2014-04-30 (×2): 10 mg via ORAL
  Filled 2014-04-29 (×3): qty 1

## 2014-04-29 MED ORDER — AMLODIPINE BESYLATE 10 MG PO TABS
10.0000 mg | ORAL_TABLET | Freq: Every day | ORAL | Status: DC
  Administered 2014-04-29: 10 mg via ORAL
  Filled 2014-04-29 (×3): qty 1

## 2014-04-29 MED ORDER — LISINOPRIL-HYDROCHLOROTHIAZIDE 20-25 MG PO TABS: 1.0000 | ORAL_TABLET | Freq: Every day | ORAL | Status: DC

## 2014-04-29 MED ORDER — TIMOLOL HEMIHYDRATE 0.5 % OP SOLN: 1.0000 [drp] | Freq: Every day | OPHTHALMIC | Status: DC

## 2014-04-29 MED ORDER — KETOROLAC TROMETHAMINE 30 MG/ML IJ SOLN
INTRAMUSCULAR | Status: AC
  Filled 2014-04-29: qty 1

## 2014-04-29 MED ORDER — PHENYLEPHRINE HCL 10 MG/ML IJ SOLN
INTRAMUSCULAR | Status: DC | PRN
  Administered 2014-04-29 (×2): 80 ug via INTRAVENOUS
  Administered 2014-04-29: 120 ug via INTRAVENOUS
  Administered 2014-04-29: 80 ug via INTRAVENOUS
  Administered 2014-04-29: 40 ug via INTRAVENOUS

## 2014-04-29 MED ORDER — ASPIRIN EC 325 MG PO TBEC: 325.0000 mg | DELAYED_RELEASE_TABLET | Freq: Every day | ORAL | Status: DC

## 2014-04-29 MED ORDER — METHOCARBAMOL 1000 MG/10ML IJ SOLN
500.0000 mg | Freq: Four times a day (QID) | INTRAVENOUS | Status: DC | PRN
  Filled 2014-04-29: qty 5

## 2014-04-29 MED ORDER — ONDANSETRON HCL 4 MG/2ML IJ SOLN
INTRAMUSCULAR | Status: DC | PRN
  Administered 2014-04-29: 4 mg via INTRAVENOUS

## 2014-04-29 SURGICAL SUPPLY — 76 items
APL SKNCLS STERI-STRIP NONHPOA (GAUZE/BANDAGES/DRESSINGS) ×1
BANDAGE ELASTIC 4 VELCRO ST LF (GAUZE/BANDAGES/DRESSINGS) ×3 IMPLANT
BANDAGE ESMARK 6X9 LF (GAUZE/BANDAGES/DRESSINGS) ×1 IMPLANT
BENZOIN TINCTURE PRP APPL 2/3 (GAUZE/BANDAGES/DRESSINGS) ×3 IMPLANT
BLADE SAGITTAL 25.0X1.19X90 (BLADE) ×2 IMPLANT
BLADE SAGITTAL 25.0X1.19X90MM (BLADE) ×1
BLADE SAW SGTL 13X75X1.27 (BLADE) ×3 IMPLANT
BNDG CMPR 9X6 STRL LF SNTH (GAUZE/BANDAGES/DRESSINGS) ×1
BNDG CMPR MED 10X6 ELC LF (GAUZE/BANDAGES/DRESSINGS) ×1
BNDG ELASTIC 6X10 VLCR STRL LF (GAUZE/BANDAGES/DRESSINGS) ×3 IMPLANT
BNDG ESMARK 6X9 LF (GAUZE/BANDAGES/DRESSINGS) ×3
BOWL SMART MIX CTS (DISPOSABLE) ×3 IMPLANT
CAPT RP KNEE ×2 IMPLANT
CEMENT HV SMART SET (Cement) ×6 IMPLANT
CLOSURE STERI-STRIP 1/2X4 (GAUZE/BANDAGES/DRESSINGS) ×1
CLOSURE WOUND 1/2 X4 (GAUZE/BANDAGES/DRESSINGS) ×2
CLSR STERI-STRIP ANTIMIC 1/2X4 (GAUZE/BANDAGES/DRESSINGS) ×1 IMPLANT
COVER SURGICAL LIGHT HANDLE (MISCELLANEOUS) ×3 IMPLANT
CUFF TOURNIQUET SINGLE 34IN LL (TOURNIQUET CUFF) ×3 IMPLANT
DRAPE ORTHO SPLIT 77X108 STRL (DRAPES) ×6
DRAPE SURG ORHT 6 SPLT 77X108 (DRAPES) ×2 IMPLANT
DRAPE U-SHAPE 47X51 STRL (DRAPES) ×3 IMPLANT
DRSG PAD ABDOMINAL 8X10 ST (GAUZE/BANDAGES/DRESSINGS) ×6 IMPLANT
DURAPREP 26ML APPLICATOR (WOUND CARE) ×3 IMPLANT
ELECT REM PT RETURN 9FT ADLT (ELECTROSURGICAL) ×3
ELECTRODE REM PT RTRN 9FT ADLT (ELECTROSURGICAL) ×1 IMPLANT
FACESHIELD WRAPAROUND (MASK) ×9 IMPLANT
FACESHIELD WRAPAROUND OR TEAM (MASK) ×1 IMPLANT
GAUZE VASELINE 3X9 (GAUZE/BANDAGES/DRESSINGS) ×2 IMPLANT
GAUZE XEROFORM 5X9 LF (GAUZE/BANDAGES/DRESSINGS) ×3 IMPLANT
GLOVE BIOGEL PI IND STRL 7.5 (GLOVE) ×1 IMPLANT
GLOVE BIOGEL PI IND STRL 8 (GLOVE) ×1 IMPLANT
GLOVE BIOGEL PI INDICATOR 7.5 (GLOVE) ×2
GLOVE BIOGEL PI INDICATOR 8 (GLOVE) ×2
GLOVE ECLIPSE 7.0 STRL STRAW (GLOVE) ×3 IMPLANT
GLOVE ORTHO TXT STRL SZ7.5 (GLOVE) ×3 IMPLANT
GOWN STRL REUS W/ TWL LRG LVL3 (GOWN DISPOSABLE) ×3 IMPLANT
GOWN STRL REUS W/ TWL XL LVL3 (GOWN DISPOSABLE) ×1 IMPLANT
GOWN STRL REUS W/TWL LRG LVL3 (GOWN DISPOSABLE) ×9
GOWN STRL REUS W/TWL XL LVL3 (GOWN DISPOSABLE) ×3
HANDPIECE INTERPULSE COAX TIP (DISPOSABLE) ×3
IMMOBILIZER KNEE 22 UNIV (SOFTGOODS) ×3 IMPLANT
KIT BASIN OR (CUSTOM PROCEDURE TRAY) ×3 IMPLANT
KIT ROOM TURNOVER OR (KITS) ×3 IMPLANT
MANIFOLD NEPTUNE II (INSTRUMENTS) ×3 IMPLANT
MARKER SPHERE PSV REFLC THRD 5 (MARKER) ×9 IMPLANT
NDL 1/2 CIR MAYO (NEEDLE) ×1 IMPLANT
NDL HYPO 25GX1X1/2 BEV (NEEDLE) ×1 IMPLANT
NEEDLE 1/2 CIR MAYO (NEEDLE) IMPLANT
NEEDLE HYPO 25GX1X1/2 BEV (NEEDLE) ×3 IMPLANT
NS IRRIG 1000ML POUR BTL (IV SOLUTION) ×3 IMPLANT
PACK TOTAL JOINT (CUSTOM PROCEDURE TRAY) ×3 IMPLANT
PAD ABD 8X10 STRL (GAUZE/BANDAGES/DRESSINGS) ×2 IMPLANT
PAD ARMBOARD 7.5X6 YLW CONV (MISCELLANEOUS) ×6 IMPLANT
PAD CAST 4YDX4 CTTN HI CHSV (CAST SUPPLIES) ×1 IMPLANT
PADDING CAST COTTON 4X4 STRL (CAST SUPPLIES) ×3
PADDING CAST COTTON 6X4 STRL (CAST SUPPLIES) ×3 IMPLANT
PADDING CAST SYNTHETIC 4 (CAST SUPPLIES) ×2
PADDING CAST SYNTHETIC 4X4 STR (CAST SUPPLIES) IMPLANT
PIN SCHANZ 4MM 130MM (PIN) ×12 IMPLANT
SET HNDPC FAN SPRY TIP SCT (DISPOSABLE) ×1 IMPLANT
SPONGE GAUZE 4X4 12PLY (GAUZE/BANDAGES/DRESSINGS) ×3 IMPLANT
SPONGE GAUZE 4X4 12PLY STER LF (GAUZE/BANDAGES/DRESSINGS) ×2 IMPLANT
STAPLER VISISTAT 35W (STAPLE) ×3 IMPLANT
STRIP CLOSURE SKIN 1/2X4 (GAUZE/BANDAGES/DRESSINGS) ×4 IMPLANT
SUCTION FRAZIER TIP 10 FR DISP (SUCTIONS) ×3 IMPLANT
SUT ETHIBOND NAB CT1 #1 30IN (SUTURE) ×3 IMPLANT
SUT VIC AB 2-0 CT1 27 (SUTURE) ×6
SUT VIC AB 2-0 CT1 TAPERPNT 27 (SUTURE) ×2 IMPLANT
SUT VICRYL 4-0 PS2 18IN ABS (SUTURE) ×3 IMPLANT
SUT VICRYL AB 2 0 TIES (SUTURE) ×3 IMPLANT
SYR CONTROL 10ML LL (SYRINGE) ×3 IMPLANT
TOWEL OR 17X24 6PK STRL BLUE (TOWEL DISPOSABLE) ×3 IMPLANT
TOWEL OR 17X26 10 PK STRL BLUE (TOWEL DISPOSABLE) ×3 IMPLANT
TRAY FOLEY CATH 16FRSI W/METER (SET/KITS/TRAYS/PACK) ×1 IMPLANT
WATER STERILE IRR 1000ML POUR (IV SOLUTION) ×3 IMPLANT

## 2014-04-29 NOTE — Transfer of Care (Signed)
Immediate Anesthesia Transfer of Care Note  Patient: Joanna Castro  Procedure(s) Performed: Procedure(s) with comments: COMPUTER ASSISTED TOTAL KNEE ARTHROPLASTY (Right) - Cemented Right Total Knee Arthroplasty  Patient Location: PACU  Anesthesia Type:GA combined with regional for post-op pain  Level of Consciousness: awake, alert , oriented and patient cooperative  Airway & Oxygen Therapy: Patient Spontanous Breathing and Patient connected to nasal cannula oxygen  Post-op Assessment: Report given to PACU RN, Post -op Vital signs reviewed and stable and Patient moving all extremities  Post vital signs: Reviewed and stable  Complications: No apparent anesthesia complications

## 2014-04-29 NOTE — Progress Notes (Signed)
Utilization review completed.  

## 2014-04-29 NOTE — Anesthesia Postprocedure Evaluation (Signed)
Anesthesia Post Note  Patient: Joanna Castro  Procedure(s) Performed: Procedure(s) (LRB): COMPUTER ASSISTED TOTAL KNEE ARTHROPLASTY (Right)  Anesthesia type: General  Patient location: PACU  Post pain: Pain level controlled and Adequate analgesia  Post assessment: Post-op Vital signs reviewed, Patient's Cardiovascular Status Stable, Respiratory Function Stable, Patent Airway and Pain level controlled  Last Vitals:  Filed Vitals:   04/29/14 1530  BP: 116/63  Pulse: 73  Temp:   Resp: 15    Post vital signs: Reviewed and stable  Level of consciousness: awake, alert  and oriented  Complications: No apparent anesthesia complications

## 2014-04-29 NOTE — Progress Notes (Signed)
Orthopedic Tech Progress Note Patient Details:  Joanna Castro 01/31/1946 [id number]  CPM Right Knee CPM Right Knee: On Right Knee Flexion (Degrees): 60 Right Knee Extension (Degrees): 0 Additional Comments: Trapeze bar and foot roll   Cammer, Mickie BailJennifer Carol 04/29/2014, 4:25 PM

## 2014-04-29 NOTE — Plan of Care (Signed)
Problem: Consults Goal: Diagnosis- Total Joint Replacement Primary Total Knee right     

## 2014-04-29 NOTE — Progress Notes (Signed)
Orthopedic Tech Progress Note Patient Details:  Joanna Castro April 22, 1946 [id number] Off cpm at 9:35 pm Patient ID: Joanna Castro, female   DOB: April 22, 1946, 68 y.o.   MRN: [id number]   [name], [name] 04/29/2014, 9:40 PM

## 2014-04-29 NOTE — Interval H&P Note (Signed)
History and Physical Interval Note:  04/29/2014 11:40 AM  Joanna Castro  has presented today for surgery, with the diagnosis of Right Knee Osteoarthritis   The various methods of treatment have been discussed with the patient and family. After consideration of risks, benefits and other options for treatment, the patient has consented to  Procedure(s) with comments: COMPUTER ASSISTED TOTAL KNEE ARTHROPLASTY (Right) - Cemented Right Total Knee Arthroplasty as a surgical intervention .  The patient's history has been reviewed, patient examined, no change in status, stable for surgery.  I have reviewed the patient's chart and labs.  Questions were answered to the patient's satisfaction.     [name]

## 2014-04-29 NOTE — Brief Op Note (Cosign Needed)
04/29/2014  2:38 PM  PATIENT:  Earnestine Leysrudy T Engen  68 y.o. female  PRE-OPERATIVE DIAGNOSIS:  Right Knee Osteoarthritis   POST-OPERATIVE DIAGNOSIS:  Right Knee Osteoarthritis   PROCEDURE:  Procedure(s) with comments: COMPUTER ASSISTED TOTAL KNEE ARTHROPLASTY (Right) - Cemented Right Total Knee Arthroplasty  SURGEON:  Surgeon(s) and Role:    * Eldred MangesMark C Yates, MD - Primary  PHYSICIAN ASSISTANT: Maud DeedSheila Issabelle [facility]  ASSISTANTS: none   ANESTHESIA:   regional and general  EBL:  Total I/O In: 1700 [I.V.:1700] Out: -   BLOOD ADMINISTERED:none  DRAINS: none   LOCAL MEDICATIONS USED:  NONE  SPECIMEN:  No Specimen  DISPOSITION OF SPECIMEN:  N/A  COUNTS:  YES  TOURNIQUET:   Total Tourniquet Time Documented: Thigh (Right) - 55 minutes Total: Thigh (Right) - 55 minutes   DICTATION: .Note written in EPIC  PLAN OF CARE: Admit to inpatient   PATIENT DISPOSITION:  PACU - hemodynamically stable.   Delay start of Pharmacological VTE agent (>24hrs) due to surgical blood loss or risk of bleeding: no

## 2014-04-29 NOTE — Anesthesia Procedure Notes (Signed)
Anesthesia Regional Block:  Adductor canal block  Pre-Anesthetic Checklist: ,, timeout performed, Correct Patient, Correct Site, Correct Laterality, Correct Procedure, Correct Position, site marked, Risks and benefits discussed,  Surgical consent,  Pre-op evaluation,  At surgeon's request and post-op pain management  Laterality: Right  Prep: chloraprep       Needles:  Injection technique: Single-shot  Needle Type: Echogenic Needle     Needle Length: 9cm 9 cm Needle Gauge: 21 and 21 G    Additional Needles:  Procedures: ultrasound guided (picture in chart) and nerve stimulator Adductor canal block  Nerve Stimulator or Paresthesia:  Response: n/a,   Additional Responses:   Narrative:  Start time: 04/29/2014 11:13 AM End time: 04/29/2014 11:22 AM Injection made incrementally with aspirations every 5 mL.  Performed by: Personally  Anesthesiologist: Dr Chaney MallingHodierne

## 2014-04-29 NOTE — Discharge Instructions (Signed)
Keep knee incision dry for 5 days post op then may wet while bathing. °Therapy daily and goal full extension and greater than 90 degrees flexion. °Call if fever or chills or increased drainage. °Go to ER if acutely short of breath or call for ambulance. °Return for follow up in 2 weeks. °May full weight bear on the surgical leg unless told otherwise. °Use knee immobilizer until able to straight leg raise off bed with knee stable. °In house walking for first 2 weeks. °

## 2014-04-29 NOTE — Anesthesia Preprocedure Evaluation (Signed)
Anesthesia Evaluation  Patient identified by MRN, date of birth, ID band Patient awake    Reviewed: Allergy & Precautions, H&P , NPO status , Patient's Chart, lab work & pertinent test results  Airway Mallampati: II  Neck ROM: full    Dental   Pulmonary sleep apnea ,          Cardiovascular hypertension,     Neuro/Psych  Headaches,    GI/Hepatic   Endo/Other  obese  Renal/GU      Musculoskeletal  (+) Arthritis -,   Abdominal   Peds  Hematology   Anesthesia Other Findings   Reproductive/Obstetrics                           Anesthesia Physical Anesthesia Plan  ASA: II  Anesthesia Plan: General and Regional   Post-op Pain Management: MAC Combined w/ Regional for Post-op pain   Induction: Intravenous  Airway Management Planned: LMA  Additional Equipment:   Intra-op Plan:   Post-operative Plan:   Informed Consent: I have reviewed the patients History and Physical, chart, labs and discussed the procedure including the risks, benefits and alternatives for the proposed anesthesia with the patient or authorized representative who has indicated his/her understanding and acceptance.     Plan Discussed with: CRNA, Anesthesiologist and Surgeon  Anesthesia Plan Comments:         Anesthesia Quick Evaluation

## 2014-04-29 NOTE — Progress Notes (Signed)
Pt. Refuses CPAP at this time. Pt. States that she just received a home CPAP & that she hasn't gotten use to wearing it yet. Pt. Is aware to inform RT or RN anytime during the night if she decides to wear CPAP.

## 2014-04-30 ENCOUNTER — Encounter (HOSPITAL_COMMUNITY): Payer: Self-pay | Admitting: Orthopaedic Surgery

## 2014-04-30 LAB — BASIC METABOLIC PANEL
BUN: 22 mg/dL (ref 6–23)
BUN: 19 mg/dL (ref 6–23)
CO2: 21 mEq/L (ref 19–32)
CO2: 18 meq/L — AB (ref 19–32)
CREATININE: 2.25 mg/dL — AB (ref 0.50–1.10)
CREATININE: 1.85 mg/dL — AB (ref 0.50–1.10)
Calcium: 8 mg/dL — ABNORMAL LOW (ref 8.4–10.5)
Calcium: 6.5 mg/dL — ABNORMAL LOW (ref 8.4–10.5)
Chloride: 104 mEq/L (ref 96–112)
Chloride: 92 mEq/L — ABNORMAL LOW (ref 96–112)
GFR calc Af Amer: 31 mL/min — ABNORMAL LOW (ref >90)
GFR calc non Af Amer: 21 mL/min — ABNORMAL LOW (ref >90)
GFR calc non Af Amer: 27 mL/min — ABNORMAL LOW (ref >90)
GFR, EST AFRICAN AMERICAN: 25 mL/min — AB (ref >90)
GLUCOSE: 108 mg/dL — AB (ref 70–99)
Glucose, Bld: 101 mg/dL — ABNORMAL HIGH (ref 70–99)
Potassium: 6 mEq/L — ABNORMAL HIGH (ref 3.7–5.3)
Potassium: 4.6 mEq/L (ref 3.7–5.3)
Sodium: 136 mEq/L — ABNORMAL LOW (ref 137–147)
Sodium: 122 mEq/L — ABNORMAL LOW (ref 137–147)

## 2014-04-30 LAB — CBC
HCT: 28 % — ABNORMAL LOW (ref 36.0–46.0)
Hemoglobin: 9 g/dL — ABNORMAL LOW (ref 12.0–15.0)
MCH: 27.4 pg (ref 26.0–34.0)
MCHC: 32.1 g/dL (ref 30.0–36.0)
MCV: 85.4 fL (ref 78.0–100.0)
Platelets: 296 10*3/uL (ref 150–400)
RBC: 3.28 MIL/uL — ABNORMAL LOW (ref 3.87–5.11)
RDW: 13.4 % (ref 11.5–15.5)
WBC: 11.6 10*3/uL — AB (ref 4.0–10.5)

## 2014-04-30 MED ORDER — SODIUM CHLORIDE 0.45 % IV BOLUS
500.0000 mL | Freq: Once | INTRAVENOUS | Status: AC
  Administered 2014-04-30: 500 mL via INTRAVENOUS

## 2014-04-30 MED ORDER — KETOROLAC TROMETHAMINE 15 MG/ML IJ SOLN: 15.0000 mg | Freq: Four times a day (QID) | INTRAMUSCULAR | Status: DC

## 2014-04-30 NOTE — Evaluation (Signed)
Physical Therapy Evaluation Patient Details Name: Joanna Castro MRN: [id number] DOB: Apr 03, 1946 Today's Date: 04/30/2014   History of Present Illness  Patient is a 68 y/o female admitted for right TKA.  PMH positive for back surgery, ankle surgery, Glaucoma and sleep apnea.  Clinical Impression  Patient presents with decreased mobility due to deficits listed in PT problem list.  She will benefit from skilled PT in the acute setting to allow return home with daughter assist and HHPT.    Follow Up Recommendations Home health PT;Supervision/Assistance - 24 hour    Equipment Recommendations  None recommended by PT    Recommendations for Other Services       Precautions / Restrictions Precautions Precautions: Fall Required Braces or Orthoses: Knee Immobilizer - Right Knee Immobilizer - Right: On when out of bed or walking Restrictions Weight Bearing Restrictions: Yes RLE Weight Bearing: Weight bearing as tolerated      Mobility  Bed Mobility Overal bed mobility: Needs Assistance Bed Mobility: Supine to Sit     Supine to sit: HOB elevated;Min assist        Transfers Overall transfer level: Needs assistance Equipment used: Rolling walker (2 wheeled) Transfers: Sit to/from Stand Sit to Stand: Min assist         General transfer comment: cues for right leg management with transfers with KI and for hand placement  Ambulation/Gait Ambulation/Gait assistance: Min assist Ambulation Distance (Feet): 20 Feet Assistive device: Rolling walker (2 wheeled) Gait Pattern/deviations: Step-to pattern;Antalgic     General Gait Details: cues for sequence and weight bearing  Stairs            Wheelchair Mobility    Modified Rankin (Stroke Patients Only)       Balance Overall balance assessment: History of Falls;Needs assistance         Standing balance support: Bilateral upper extremity supported Standing balance-Leahy Scale: Poor Standing balance comment:  needs support for balance                             Pertinent Vitals/Pain Min to mod c/o right knee pain with ROM and ambulation, comfortable at rest    Home Living Family/patient expects to be discharged to:: Private residence Living Arrangements: Other relatives Available Help at Discharge: Family;Available 24 hours/day Type of Home: House Home Access: Ramped entrance;Stairs to enter     Home Layout: One level Home Equipment: Walker - 2 wheels;Bedside commode;Wheelchair - manual      Prior Function Level of Independence: Independent with assistive device(s)               Hand Dominance        Extremity/Trunk Assessment   Upper Extremity Assessment: Defer to OT evaluation           Lower Extremity Assessment: RLE deficits/detail;LLE deficits/detail RLE Deficits / Details: Ankle AROM WFL, knee flexion approx 45 degrees AAROM, extension approx -10 degrees (ace wrap in place).   LLE Deficits / Details: AROM WFL, strength grossly 4/5; extensive ecchymosis and boggy knee with h/o fall previous to surgery (couple of weeks ago).     Communication   Communication: No difficulties  Cognition Arousal/Alertness: Awake/alert Behavior During Therapy: WFL for tasks assessed/performed Overall Cognitive Status: Within Functional Limits for tasks assessed                      General Comments      Exercises Total Joint  Exercises Ankle Circles/Pumps: AROM;10 reps;Supine Quad Sets: AROM;10 reps;Supine Heel Slides: AAROM;10 reps;Supine Hip ABduction/ADduction: AROM;10 reps;Supine      Assessment/Plan    PT Assessment Patient needs continued PT services  PT Diagnosis Difficulty walking;Acute pain   PT Problem List Decreased strength;Decreased range of motion;Decreased mobility;Decreased safety awareness;Decreased knowledge of precautions;Decreased balance;Pain;Decreased knowledge of use of DME  PT Treatment Interventions DME instruction;Gait  training;Functional mobility training;Therapeutic activities;Patient/family education;Balance training;Therapeutic exercise   PT Goals (Current goals can be found in the Care Plan section) Acute Rehab PT Goals Patient Stated Goal: To go home with daughter assist PT Goal Formulation: With patient Time For Goal Achievement: 05/04/14 Potential to Achieve Goals: Good    Frequency Min 6X/week   Barriers to discharge        Co-evaluation               End of Session Equipment Utilized During Treatment: Gait belt;Right knee immobilizer Activity Tolerance: Patient tolerated treatment well Patient left: in chair;with call bell/phone within reach           Time: 0915-0948 PT Time Calculation (min): 33 min   Charges:   PT Evaluation $Initial PT Evaluation Tier I: 1 Procedure PT Treatments $Gait Training: 8-22 mins $Therapeutic Exercise: 8-22 mins   PT G Codes:          WYNN,CYNDI 04/30/2014, 10:28 AM Sheran Lawlessyndi Wynn, PT [phone] 04/30/2014

## 2014-04-30 NOTE — Progress Notes (Signed)
Patient voided twice during the shift (very small amounts of urine; less than 50 ml total).  Applied warm compression on lower abd; patient still not voiding and has no urge to void. Did bladder scan shows 108 ml of urine. Encouraged fluids and patient is still getting iv fluids.  Will have day shift to f/u and monitor.

## 2014-04-30 NOTE — Progress Notes (Addendum)
Subjective: 1 Day Post-Op Procedure(s) (LRB): COMPUTER ASSISTED TOTAL KNEE ARTHROPLASTY (Right) Patient reports pain as mild.    Objective: Vital signs in last 24 hours: Temp:  [97.6 F (36.4 C)-98.8 F (37.1 C)] 98.1 F (36.7 C) (06/23 0549) Pulse Rate:  [65-81] 75 (06/23 0549) Resp:  [7-21] 18 (06/23 0549) BP: (86-144)/(35-83) 92/53 mmHg (06/23 0549) SpO2:  [92 %-100 %] 95 % (06/23 0549) Weight:  [87.998 kg (194 lb)] 87.998 kg (194 lb) (06/22 0944)  Intake/Output from previous day: 06/22 0701 - 06/23 0700 In: 1900 [I.V.:1900] Out: -  Intake/Output this shift:     Recent Labs  04/30/14 0457  HGB 9.0*    Recent Labs  04/30/14 0457  WBC 11.6*  RBC 3.28*  HCT 28.0*  PLT 296    Recent Labs  04/30/14 0457  NA 136*  K 6.0*  CL 104  CO2 21  BUN 22  CREATININE 2.25*  GLUCOSE 101*  CALCIUM 8.0*   No results found for this basename: LABPT, INR,  in the last 72 hours  Neurologically intact  Assessment/Plan: 1 Day Post-Op Procedure(s) (LRB): COMPUTER ASSISTED TOTAL KNEE ARTHROPLASTY (Right) No void yet, K is 6.0  Bladder scan 100cc . Bolus one half normal saline 500 cc and check UO.   IF no response will need additional Tx  creatinie is 2.25   Lisinopril and HCTZ stopped.    YATES,MARK C 04/30/2014, 8:29 AM

## 2014-04-30 NOTE — Progress Notes (Signed)
Orthopedic Tech Progress Note Patient Details:  Joanna Castro 1946-03-01 [id number] Off cpm at 8:00 pm Patient ID: Joanna Castro, female   DOB: 1946-03-01, 68 y.o.   MRN: [id number]   [name], Garrick Midgley Craig 04/30/2014, 8:00 PM

## 2014-04-30 NOTE — Progress Notes (Signed)
Pt. Refuses CPAP at this time. Pt. Was made aware to inform RT or RN if she changes her mind.

## 2014-04-30 NOTE — Progress Notes (Signed)
Physical Therapy Treatment Patient Details Name: Joanna Castro MRN: [id number] DOB: [birth year] Today's Date: 04/30/2014    History of Present Illness Patient is a 68 y/o female admitted for right TKA.  PMH positive for back surgery, ankle surgery, Glaucoma and sleep apnea.    PT Comments    Patient progressing with ambulation and mobility this pm.  Finally voided a little.  Follow Up Recommendations  Home health PT;Supervision/Assistance - 24 hour     Equipment Recommendations  None recommended by PT    Recommendations for Other Services       Precautions / Restrictions Precautions Precautions: Fall Required Braces or Orthoses: Knee Immobilizer - Right Restrictions RLE Weight Bearing: Weight bearing as tolerated    Mobility  Bed Mobility Overal bed mobility: Needs Assistance Bed Mobility: Supine to Sit;Sit to Supine     Supine to sit: HOB elevated;Min assist Sit to supine: Min assist   General bed mobility comments: assist for right LE  Transfers Overall transfer level: Needs assistance Equipment used: Rolling walker (2 wheeled)   Sit to Stand: Min guard            Ambulation/Gait Ambulation/Gait assistance: Min guard;Supervision Ambulation Distance (Feet): 90 Feet Assistive device: Rolling walker (2 wheeled) Gait Pattern/deviations: Step-through pattern     General Gait Details: cues for sequence and weight bearing   Stairs            Wheelchair Mobility    Modified Rankin (Stroke Patients Only)       Balance Overall balance assessment: Needs assistance         Standing balance support: Bilateral upper extremity supported Standing balance-Leahy Scale: Poor                      Cognition Arousal/Alertness: Awake/alert Behavior During Therapy: WFL for tasks assessed/performed Overall Cognitive Status: Within Functional Limits for tasks assessed                      Exercises      General Comments General  comments (skin integrity, edema, etc.): able to void on BSC in bathroom      Pertinent Vitals/Pain Min c/o right knee was premedicated    Home Living                      Prior Function            PT Goals (current goals can now be found in the care plan section) Progress towards PT goals: Progressing toward goals    Frequency  Min 6X/week    PT Plan      Co-evaluation             End of Session Equipment Utilized During Treatment: Gait belt;Right knee immobilizer   Patient left: in bed;with call bell/phone within reach     Time: 1515-1540 PT Time Calculation (min): 25 min  Charges:  $Gait Training: 8-22 mins $Therapeutic Activity: 8-22 mins                    G Codes:      WYNN,CYNDI 04/30/2014, 4:52 PM Sheran Lawlessyndi Wynn, PT [phone] 04/30/2014

## 2014-04-30 NOTE — Op Note (Signed)
[name]:  Pain, [name]              ACCOUNT NO.:  [id number]  MEDICAL RECORD NO.:  [id number]  LOCATION:  [id number]                        FACILITY:  [facility]  PHYSICIAN:  Mark C. Ophelia CharterYates, M.D.    DATE OF BIRTH:  [birth year]  DATE OF PROCEDURE:  04/29/2014 DATE OF DISCHARGE:                              OPERATIVE REPORT   PREOPERATIVE DIAGNOSIS:  Right knee osteoarthritis.  POSTOPERATIVE DIAGNOSIS:  Right knee osteoarthritis.  PROCEDURE:  Right total knee arthroplasty cemented, computer assist, DePuy #3 LCS rotating platform, size 3 femur with lugs, 10 mm rotating poly, #3 tibia, 35 mm 3 PEG cemented patella.  SURGEON:  Mark C. Ophelia CharterYates, M.D.  ASSISTANT:  Maud DeedSheila Vernon, PA-C, medically necessary and present for the entire procedure.  ANESTHESIA:  Preoperative obturator nerve block.  TOURNIQUET TIME:  51 minutes.  DRAINS:  None.  COMPLICATIONS:  None.  INDICATIONS:  This is a 68 year old female with progressive knee osteoarthritis with grade 4 changes lateral compartment and patellofemoral compartment.  She has failed conservative treatment including anti-inflammatories, walking aids, and intra-articular injections.  She has had a history of catching in her knee as well as recent falling.  After induction of general anesthesia, standard prepping and draping was performed.  Ancef was given prophylactically 2 g.  Proximal thigh tourniquet was applied.  DuraPrep, time-out procedure, impervious stockinette, Coban, split sheets and drapes sterile skin marker and Betadine, Steri-Drape sealing the skin.  Leg was wrapped in Esmarch, tourniquet inflated.  Midline incision was made.  Medial parapatellar incision, splitting the quad tendon between the medial one-third and lateral two-thirds.  Patella was everted.  A 10 mm removed off the patella to facet, sized for a 35, 3 PEGs drilled. Spur was removed off the femur, medial, and lateral.  Worse on the lateral aspect, grade 4 changes in  lateral grooves and lateral compartment consistent with osteoarthritis.  Pins were placed in the femur.  Stab incision in the mid tibia, computer model was generated.  A 8 mm were taken off the femur and off the tibia.  Rotation cuts were less than 1 degree from planned.  No varus or valgus malalignment. Anterior cut on the femur was 1 mm anterior more than planned.  Box cut was made on the femur after AP cut was made on the tibia preparation #3 size.  Meniscal remnants were resected.  Posterior spurs removed off the femur, three-quarter curved osteotome.  All PCL was resected. Preoperatively, the patient had 40 degrees of varus and 3 degree knee flexion and contracture.  Once trials were inserted.  Knee reached full extension.  There was no varus and valgus in both flexion and extension gaps were both 22.5 mm identical, medial, and lateral.  Trials removed and permanent prosthesis were opened, pulsatile lavage preparation cementing of the tibia.  After meticulous drying followed by femur permanent poly insertion and then cementing of the patella was performed.  Cement was hardened for 15 minutes.  Tourniquet was deflated.  Hemostasis obtained and standard layered closure with nonabsorbable in the quad tendon and medial retinaculum with 2-0 Vicryl, superficial retinaculum, subcutaneous tissue, and subcuticular closure. Tincture of benzoin and Steri-Strips, postop dressing.     Mark C.  Ophelia CharterYates, M.D.     MCY/MEDQ  D:  04/29/2014  T:  04/30/2014  Job:  [id number]

## 2014-04-30 NOTE — Progress Notes (Signed)
Patient experiencing difficulty urinating. Patient's creatinine 2.25 and potassium 6.0. Dr. Ophelia CharterYates notified this morning and orders were given to administer 500mL 0.45% Normal Saline bolus. Patient only voided 150mL by 1600. Patient was bladder scanned and 389mL was recorded. Dr. Ophelia CharterYates notified of bladder scan volume. Orders were given to in-and-out cath and obtain a BMP. Patient was in-and-out cathed and 425mL resulted. Will continue to monitor patients output over the next 6 hours.

## 2014-04-30 NOTE — Evaluation (Signed)
Occupational Therapy Evaluation Patient Details Name: Joanna Castro MRN: [id number] DOB: Feb 01, 1946 Today's Date: 04/30/2014    History of Present Illness Patient is a 68 y/o female admitted for right TKA.  PMH positive for back surgery, ankle surgery, Glaucoma and sleep apnea.   Clinical Impression   Pt admitted with the above diagnoses and presents with below problem list. Pt will benefit from continued acute OT to address the below listed deficits and maximize independence with basic ADLs prior to d/c home with family. PTA pt was modified independent with ADLs, using a cane for functional mobility. Pt currently is at min guard level for ADLs. Pt will have family/friends to provide 24 hour supervision at d/c.      Follow Up Recommendations  Supervision/Assistance - 24 hour;No OT follow up    Equipment Recommendations  None recommended by OT    Recommendations for Other Services       Precautions / Restrictions Precautions Precautions: Fall Required Braces or Orthoses: Knee Immobilizer - Right Knee Immobilizer - Right: On when out of bed or walking Restrictions Weight Bearing Restrictions: Yes RLE Weight Bearing: Weight bearing as tolerated      Mobility Bed Mobility Overal bed mobility: Needs Assistance Bed Mobility: Supine to Sit     Supine to sit: HOB elevated;Min assist     General bed mobility comments: not assessed - pt in recliner  Transfers Overall transfer level: Needs assistance Equipment used: Rolling walker (2 wheeled) Transfers: Sit to/from Stand Sit to Stand: Min guard         General transfer comment: no physical assist needed    Balance Overall balance assessment: Needs assistance         Standing balance support: Bilateral upper extremity supported;During functional activity Standing balance-Leahy Scale: Poor Standing balance comment: relies heavily on rw                            ADL Overall ADL's : Needs  assistance/impaired Eating/Feeding: Set up;Sitting   Grooming: Set up;Sitting   Upper Body Bathing: Supervision/ safety;Sitting   Lower Body Bathing: Min guard;Sit to/from stand;With adaptive equipment   Upper Body Dressing : Set up;Sitting   Lower Body Dressing: Min guard;With adaptive equipment;Sit to/from stand   Toilet Transfer: Min guard;Ambulation;RW;BSC   Toileting- ArchitectClothing Manipulation and Hygiene: Min guard;Sit to/from stand   Tub/ Shower Transfer: Min guard;Ambulation;3 in 1;Rolling walker   Functional mobility during ADLs: Min guard General ADL Comments: Educated pt on techniques and AE for safe completion of basic ADLs. Discussed toilet and shower tranfers at home, use of reacher for LB dressing.     Vision                     Perception     Praxis      Pertinent Vitals/Pain 4/10 pain.     Hand Dominance     Extremity/Trunk Assessment Upper Extremity Assessment Upper Extremity Assessment: Overall WFL for tasks assessed          Communication Communication Communication: No difficulties   Cognition Arousal/Alertness: Awake/alert Behavior During Therapy: WFL for tasks assessed/performed Overall Cognitive Status: Within Functional Limits for tasks assessed                     General Comments       Exercises       Shoulder Instructions      Home Living Family/patient expects to  be discharged to:: Private residence Living Arrangements: Other relatives Available Help at Discharge: Family;Available 24 hours/day Type of Home: House Home Access: Ramped entrance;Stairs to enter     Home Layout: One level     Bathroom Shower/Tub: Tub/shower unit;Walk-in shower   Bathroom Toilet: Handicapped height Bathroom Accessibility: Yes How Accessible: Accessible via walker Home Equipment: Walker - 2 wheels;Bedside commode;Wheelchair - Careers advisermanual;Other (comment);Adaptive equipment;Shower seat - built in Lexicographer(reacher) Cendant Corporationdaptive Equipment:  Reacher    Lives With: Daughter    Prior Functioning/Environment Level of Independence: Independent with assistive device(s)        Comments: used cane PTA    OT Diagnosis: Acute pain   OT Problem List: Decreased strength;Decreased range of motion;Decreased activity tolerance;Impaired balance (sitting and/or standing);Decreased knowledge of use of DME or AE;Decreased knowledge of precautions;Pain   OT Treatment/Interventions: Self-care/ADL training;Therapeutic exercise;DME and/or AE instruction;Therapeutic activities;Patient/family education;Balance training    OT Goals(Current goals can be found in the care plan section) Acute Rehab OT Goals Patient Stated Goal: get back to walking with cane OT Goal Formulation: With patient Time For Goal Achievement: 05/07/14 Potential to Achieve Goals: Good ADL Goals Pt Will Perform Grooming: with supervision;standing Pt Will Perform Lower Body Bathing: with supervision;with adaptive equipment;sit to/from stand Pt Will Perform Lower Body Dressing: with supervision;with adaptive equipment;sit to/from stand Pt Will Transfer to Toilet: with supervision;ambulating (3n1 over toilet) Pt Will Perform Toileting - Clothing Manipulation and hygiene: with supervision;sit to/from stand Pt Will Perform Tub/Shower Transfer: with supervision;ambulating;3 in 1;rolling walker  OT Frequency: Min 3X/week   Barriers to D/C:            Co-evaluation              End of Session Equipment Utilized During Treatment: Gait belt;Rolling walker;Right knee immobilizer CPM Right Knee CPM Right Knee: Off Additional Comments: off at 0915  Activity Tolerance: Patient tolerated treatment well;Patient limited by fatigue Patient left: in chair;with call bell/phone within reach   Time: 1140-1202 OT Time Calculation (min): 22 min Charges:  OT General Charges $OT Visit: 1 Procedure OT Evaluation $Initial OT Evaluation Tier I: 1 Procedure OT Treatments $Self  Care/Home Management : 8-22 mins G-Codes:    Pilar GrammesMathews, Kathryn H 04/30/2014, 12:45 PM

## 2014-05-01 LAB — CBC
HCT: 25 % — ABNORMAL LOW (ref 36.0–46.0)
Hemoglobin: 8.2 g/dL — ABNORMAL LOW (ref 12.0–15.0)
MCH: 27.2 pg (ref 26.0–34.0)
MCHC: 32.8 g/dL (ref 30.0–36.0)
MCV: 83.1 fL (ref 78.0–100.0)
Platelets: 219 10*3/uL (ref 150–400)
RBC: 3.01 MIL/uL — ABNORMAL LOW (ref 3.87–5.11)
RDW: 13.4 % (ref 11.5–15.5)
WBC: 9 10*3/uL (ref 4.0–10.5)

## 2014-05-01 LAB — BASIC METABOLIC PANEL
BUN: 21 mg/dL (ref 6–23)
CALCIUM: 8.5 mg/dL (ref 8.4–10.5)
CO2: 21 mEq/L (ref 19–32)
CREATININE: 1.81 mg/dL — AB (ref 0.50–1.10)
Chloride: 97 mEq/L (ref 96–112)
GFR calc non Af Amer: 28 mL/min — ABNORMAL LOW (ref >90)
GFR, EST AFRICAN AMERICAN: 32 mL/min — AB (ref >90)
Glucose, Bld: 132 mg/dL — ABNORMAL HIGH (ref 70–99)
Potassium: 5.2 mEq/L (ref 3.7–5.3)
Sodium: 131 mEq/L — ABNORMAL LOW (ref 137–147)

## 2014-05-01 NOTE — Progress Notes (Signed)
Physical Therapy Treatment Patient Details Name: Joanna Castro MRN: [id number] DOB: 07/31/1946 Today's Date: 05/01/2014    History of Present Illness Patient is a 68 y/o female admitted for right TKA.  PMH positive for back surgery, ankle surgery, Glaucoma and sleep apnea.    PT Comments    Patient ambulation limited by pain this morning. Otherwise, mobilizing well. Patient is planning to DC later today. Will follow up with ambulation this afternoon. Patient does have ramp to enter house.   Follow Up Recommendations  Home health PT;Supervision/Assistance - 24 hour     Equipment Recommendations       Recommendations for Other Services       Precautions / Restrictions Precautions Precautions: Fall Required Braces or Orthoses: Knee Immobilizer - Right Knee Immobilizer - Right: On when out of bed or walking Restrictions RLE Weight Bearing: Weight bearing as tolerated    Mobility  Bed Mobility                  Transfers Overall transfer level: Needs assistance Equipment used: Rolling walker (2 wheeled)   Sit to Stand: Min guard         General transfer comment: no physical assist needed  Ambulation/Gait Ambulation/Gait assistance: Min guard Ambulation Distance (Feet): 40 Feet Assistive device: Rolling walker (2 wheeled) Gait Pattern/deviations: Step-to pattern;Decreased step length - right;Decreased step length - left     General Gait Details: cues for sequence and weight bearing. Limited ambulation due to pain   Stairs            Wheelchair Mobility    Modified Rankin (Stroke Patients Only)       Balance                                    Cognition Arousal/Alertness: Awake/alert Behavior During Therapy: WFL for tasks assessed/performed Overall Cognitive Status: Within Functional Limits for tasks assessed                      Exercises Total Joint Exercises Quad Sets: AROM;10 reps;Supine Heel Slides: AAROM;10  reps;Supine Hip ABduction/ADduction: AROM;10 reps;Supine Straight Leg Raises: AAROM;10 reps    General Comments        Pertinent Vitals/Pain 10/10 R knee. RN made aware    Home Living                      Prior Function            PT Goals (current goals can now be found in the care plan section) Progress towards PT goals: Progressing toward goals    Frequency  Min 6X/week    PT Plan Current plan remains appropriate    Co-evaluation             End of Session Equipment Utilized During Treatment: Gait belt;Right knee immobilizer Activity Tolerance: Patient tolerated treatment well Patient left: in chair;with call bell/phone within reach     Time: 0740-0823 PT Time Calculation (min): 43 min  Charges:  $Gait Training: 8-22 mins $Therapeutic Exercise: 8-22 mins $Therapeutic Activity: 8-22 mins                    G Codes:      Fredrich BirksRobinette, [name] 05/01/2014, 11:40 AM 05/01/2014 Fredrich Birksobinette, [name] PTA [phone] pager [phone] office

## 2014-05-01 NOTE — Care Management Note (Signed)
CARE MANAGEMENT NOTE 05/01/2014  Patient:  Joanna LeysGLASSCOE,Karen T   Account Number:  [id number]  Date Initiated:  05/01/2014  Documentation initiated by:  Vance PeperBRADY,SUSAN  Subjective/Objective Assessment:   68 yr old female s/p right total knee arthroplasty.     Action/Plan:   Case manager spoke with patient concerning home health and DME needs at discharge. Choice offered. Referral [facility], [facility]. Patient has family support at discharge.   Anticipated DC Date:  05/01/2014   Anticipated DC Plan:  HOME W HOME HEALTH SERVICES      DC Planning Services  CM consult      [facility]  DURABLE MEDICAL EQUIPMENT   Choice offered to / List presented to:  C-1 Patient   DME arranged  3-N-1  [facility]  CPM      DME agency  [employer]     HH arranged  HH-2 PT      HH agency  [facility]   Status of service:  Completed, signed off Medicare Important Message given?  NA - LOS <3 / Initial given by admissions (If response is "NO", the following Medicare IM given date fields will be blank) Date Medicare IM given:   Date Additional Medicare IM given:    Discharge Disposition:  HOME W HOME HEALTH SERVICES  Per UR Regulation:  Reviewed for med. necessity/level of care/duration of stay

## 2014-05-01 NOTE — Progress Notes (Signed)
Patient discharged home. Prescriptions given. Pain medication given before discharge.

## 2014-05-01 NOTE — Progress Notes (Signed)
Subjective: 2 Days Post-Op Procedure(s) (LRB): COMPUTER ASSISTED TOTAL KNEE ARTHROPLASTY (Right) Patient reports pain as moderate.    Objective: Vital signs in last 24 hours: Temp:  [98.2 F (36.8 C)-99.4 F (37.4 C)] 99.4 F (37.4 C) (06/24 0546) Pulse Rate:  [81-89] 89 (06/24 0546) Resp:  [16-18] 18 (06/24 0546) BP: (100-114)/(50-63) 113/50 mmHg (06/24 0546) SpO2:  [96 %-98 %] 97 % (06/24 0546)  Intake/Output from previous day: 06/23 0701 - 06/24 0700 In: 1752.5 [P.O.:1320; I.V.:432.5] Out: 1075 [Urine:1075] Intake/Output this shift:     Recent Labs  04/30/14 0457 05/01/14 0528  HGB 9.0* 8.2*    Recent Labs  04/30/14 0457 05/01/14 0528  WBC 11.6* 9.0  RBC 3.28* 3.01*  HCT 28.0* 25.0*  PLT 296 219    Recent Labs  04/30/14 0457 04/30/14 1710  NA 136* 122*  K 6.0* 4.6  CL 104 92*  CO2 21 18*  BUN 22 19  CREATININE 2.25* 1.85*  GLUCOSE 101* 108*  CALCIUM 8.0* 6.5*   No results found for this basename: LABPT, INR,  in the last 72 hours  Neurologically intact  Assessment/Plan: 2 Days Post-Op Procedure(s) (LRB): COMPUTER ASSISTED TOTAL KNEE ARTHROPLASTY (Right) Plan;   Decrease fluid intake, discussed with patient. Recheck BMET. If OK then d/c home this afternoon.   Azelia Reiger C 05/01/2014, 8:03 AM

## 2014-05-01 NOTE — Progress Notes (Signed)
Physical Therapy Treatment Patient Details Name: Joanna Castro MRN: [id number] DOB: 11-25-1945 Today's Date: 05/01/2014    History of Present Illness Patient is a 68 y/o female admitted for right TKA.  PMH positive for back surgery, ankle surgery, Glaucoma and sleep apnea.    PT Comments    Patient seen this afternoon to progress ambulation. Patient still limited by pain but able to walk a little further. Stated that she doesn't have to walk as far in the house. Patient has a ramp and a wheelchair that she plans on using to get into the house. Patient educated on car transfer and precautions prior to end of session.   Follow Up Recommendations  Home health PT;Supervision/Assistance - 24 hour     Equipment Recommendations  None recommended by PT    Recommendations for Other Services       Precautions / Restrictions Precautions Precautions: Fall Required Braces or Orthoses: Knee Immobilizer - Right Knee Immobilizer - Right: On when out of bed or walking Restrictions RLE Weight Bearing: Weight bearing as tolerated    Mobility  Bed Mobility Overal bed mobility: Needs Assistance Bed Mobility: Supine to Sit;Sit to Supine     Supine to sit: Min assist Sit to supine: Min assist   General bed mobility comments: assist for right LE  Transfers Overall transfer level: Needs assistance Equipment used: Rolling walker (2 wheeled) Transfers: Sit to/from Stand Sit to Stand: Min guard;Min assist         General transfer comment: Min assist from lower surface of commode. Cues for best technique  Ambulation/Gait Ambulation/Gait assistance: Supervision Ambulation Distance (Feet): 60 Feet Assistive device: Rolling walker (2 wheeled) Gait Pattern/deviations: Step-to pattern;Decreased step length - right;Decreased step length - left     General Gait Details: Cues for sequence and posture.    Stairs            Wheelchair Mobility    Modified Rankin (Stroke Patients  Only)       Balance                                    Cognition Arousal/Alertness: Awake/alert Behavior During Therapy: WFL for tasks assessed/performed Overall Cognitive Status: Within Functional Limits for tasks assessed                      Exercises Total Joint Exercises Quad Sets: AROM;10 reps;Supine Heel Slides: AAROM;10 reps;Supine Hip ABduction/ADduction: AROM;10 reps;Supine Straight Leg Raises: AAROM;10 reps    General Comments        Pertinent Vitals/Pain 7/10 R knee pain. patient repositioned for comfort     Home Living                      Prior Function            PT Goals (current goals can now be found in the care plan section) Progress towards PT goals: Progressing toward goals    Frequency  Min 6X/week    PT Plan Current plan remains appropriate    Co-evaluation             End of Session Equipment Utilized During Treatment: Gait belt;Right knee immobilizer Activity Tolerance: Patient tolerated treatment well Patient left: with call bell/phone within reach;in bed     Time: [id number] PT Time Calculation (min): 26 min  Charges:  $Gait Training: 23-37 mins $Therapeutic Exercise: 8-22  mins $Therapeutic Activity: 8-22 mins                    G Codes:      Fredrich BirksRobinette, [name] 05/01/2014, 2:05 PM  05/01/2014 Fredrich Birksobinette, [name] PTA [phone] pager [phone] office

## 2014-05-07 NOTE — Discharge Summary (Signed)
Physician Discharge Summary  Patient ID: Joanna Castro Brister MRN: [id number] DOB/AGE: 1946/10/01 68 y.o.  Admit date: 04/29/2014 Discharge date: 05/01/2014  Admission Diagnoses:  Osteoarthritis of right knee  Discharge Diagnoses:  Principal Problem:   Osteoarthritis of right knee   Past Medical History  Diagnosis Date  . Spinal stenosis   . Scoliosis   . Hypertension   . Arthritis   . High cholesterol   . Glaucoma   . Sleep apnea     intermittently uses cpap ( started 2 weeks ago)  . Kidney cysts     effecting creat.  Marland Kitchen. Headache(784.0)   . History of blood transfusion     Surgeries: Procedure(s): COMPUTER ASSISTED TOTAL KNEE ARTHROPLASTY right on 04/29/2014   Consultants (if any):  none  Discharged Condition: Improved  Hospital Course: Joanna Castro Montesinos is an 68 y.o. female who was admitted 04/29/2014 with a diagnosis of Osteoarthritis of right knee and went to the operating room on 04/29/2014 and underwent the above named procedures.    She was given perioperative antibiotics:  Anti-infectives   Start     Dose/Rate Route Frequency Ordered Stop   04/29/14 0600  ceFAZolin (ANCEF) IVPB 2 g/50 mL premix     2 g 100 mL/hr over 30 Minutes Intravenous On call to O.R. 04/28/14 1317 04/29/14 1250    Pt with low urinary output on the first post op day that improved with bolus of fluids.  Good progression with PT/OT.  She was given sequential compression devices, early ambulation, and aspirin for DVT prophylaxis.  She benefited maximally from the hospital stay and there were no complications.    Recent vital signs:  Filed Vitals:   05/01/14 1428  BP: 105/56  Pulse: 85  Temp: 99 F (37.2 C)  Resp: 18    Recent laboratory studies:  Lab Results  Component Value Date   HGB 8.2* 05/01/2014   HGB 9.0* 04/30/2014   HGB 12.1 04/22/2014   Lab Results  Component Value Date   WBC 9.0 05/01/2014   PLT 219 05/01/2014   Lab Results  Component Value Date   INR 1.06 04/22/2014    Lab Results  Component Value Date   NA 131* 05/01/2014   K 5.2 05/01/2014   CL 97 05/01/2014   CO2 21 05/01/2014   BUN 21 05/01/2014   CREATININE 1.81* 05/01/2014   GLUCOSE 132* 05/01/2014    Discharge Medications:     Medication List         amLODipine 10 MG tablet  Commonly known as:  NORVASC  Take 10 mg by mouth at bedtime.     aspirin EC 325 MG tablet  Take 1 tablet (325 mg total) by mouth daily.     cetirizine 10 MG tablet  Commonly known as:  ZYRTEC  Take 10 mg by mouth daily.     denosumab 60 MG/ML Soln injection  Commonly known as:  PROLIA  Inject 60 mg into the skin every 6 (six) months. Administer in upper arm, thigh, or abdomen     gabapentin 300 MG capsule  Commonly known as:  NEURONTIN  Take 600 mg by mouth 2 (two) times daily.     lisinopril-hydrochlorothiazide 20-25 MG per tablet  Commonly known as:  PRINZIDE,ZESTORETIC  Take 1 tablet by mouth daily.     methocarbamol 500 MG tablet  Commonly known as:  ROBAXIN  Take 1 tablet (500 mg total) by mouth every 6 (six) hours as needed for muscle spasms (spasm).  multivitamin with minerals tablet  Take 1 tablet by mouth daily.     naproxen sodium 220 MG tablet  Commonly known as:  ANAPROX  Take 440 mg by mouth 2 (two) times daily with a meal.     oxyCODONE-acetaminophen 5-325 MG per tablet  Commonly known as:  ROXICET  Take 1-2 tablets by mouth every 4 (four) hours as needed.     pravastatin 20 MG tablet  Commonly known as:  PRAVACHOL  Take 20 mg by mouth at bedtime.     timolol 0.5 % ophthalmic solution  Commonly known as:  BETIMOL  Place 1 drop into both eyes daily.     traMADol 50 MG tablet  Commonly known as:  ULTRAM  Take 50 mg by mouth every 12 (twelve) hours as needed for moderate pain.        Diagnostic Studies: Dg Chest 2 View  04/22/2014   CLINICAL DATA:  Preop knee replacement  EXAM: CHEST  2 VIEW  COMPARISON:  11/09/2010  FINDINGS: The heart size and mediastinal contours are  within normal limits. Both lungs are clear. The visualized skeletal structures are unremarkable.  IMPRESSION: No active cardiopulmonary disease.   Electronically Signed   By: Marlan Palauharles  Clark M.D.   On: 04/22/2014 10:48    Disposition: 01-Home or Self Care  DISCHARGE INSTRUCTIONS: Keep knee incision dry for 5 days post op then may wet while bathing. Therapy daily and goal full extension and greater than 90 degrees flexion. Call if fever or chills or increased drainage. Go to ER if acutely short of breath or call for ambulance. Return for follow up in 2 weeks. May full weight bear on the surgical leg unless told otherwise. Use knee immobilizer until able to straight leg raise off bed with knee stable. In house walking for first 2 weeks.       Follow-up Information   Follow up with Eldred MangesYATES,MARK C, MD. Schedule an appointment as soon as possible for a visit in 2 weeks.   Specialty:  Orthopedic Surgery   Contact information:   [address] [place] [address] [phone]        Signed: Wende NeighborsVERNON,Terell Kincy M 05/07/2014, 10:02 AM

## 2014-06-03 ENCOUNTER — Other Ambulatory Visit: Payer: Self-pay | Admitting: Orthopaedic Surgery

## 2014-06-03 ENCOUNTER — Ambulatory Visit
Admission: RE | Admit: 2014-06-03 | Discharge: 2014-06-03 | Disposition: A | Discharge status: Home / Self Care | Payer: Commercial Managed Care - HMO | Source: Ambulatory Visit | Attending: Orthopaedic Surgery | Admitting: Orthopaedic Surgery

## 2014-06-03 DIAGNOSIS — R609 Edema, unspecified: Secondary | ICD-10-CM

## 2016-04-08 ENCOUNTER — Emergency Department (HOSPITAL_BASED_OUTPATIENT_CLINIC_OR_DEPARTMENT_OTHER)
Admission: EM | Admit: 2016-04-08 | Discharge: 2016-04-08 | Disposition: A | Discharge status: Home / Self Care | Payer: Medicare HMO | Attending: Emergency Medicine | Admitting: Emergency Medicine

## 2016-04-08 ENCOUNTER — Encounter (HOSPITAL_BASED_OUTPATIENT_CLINIC_OR_DEPARTMENT_OTHER): Payer: Self-pay | Admitting: Emergency Medicine

## 2016-04-08 DIAGNOSIS — I10 Essential (primary) hypertension: Secondary | ICD-10-CM | POA: Diagnosis not present

## 2016-04-08 DIAGNOSIS — Z23 Encounter for immunization: Secondary | ICD-10-CM | POA: Insufficient documentation

## 2016-04-08 DIAGNOSIS — W208XXA Other cause of strike by thrown, projected or falling object, initial encounter: Secondary | ICD-10-CM | POA: Diagnosis not present

## 2016-04-08 DIAGNOSIS — Y92007 Garden or yard of unspecified non-institutional (private) residence as the place of occurrence of the external cause: Secondary | ICD-10-CM | POA: Insufficient documentation

## 2016-04-08 DIAGNOSIS — Y999 Unspecified external cause status: Secondary | ICD-10-CM | POA: Diagnosis not present

## 2016-04-08 DIAGNOSIS — M1990 Unspecified osteoarthritis, unspecified site: Secondary | ICD-10-CM | POA: Insufficient documentation

## 2016-04-08 DIAGNOSIS — Y939 Activity, unspecified: Secondary | ICD-10-CM | POA: Insufficient documentation

## 2016-04-08 DIAGNOSIS — S91011A Laceration without foreign body, right ankle, initial encounter: Secondary | ICD-10-CM | POA: Insufficient documentation

## 2016-04-08 DIAGNOSIS — Z791 Long term (current) use of non-steroidal anti-inflammatories (NSAID): Secondary | ICD-10-CM | POA: Diagnosis not present

## 2016-04-08 DIAGNOSIS — Z79899 Other long term (current) drug therapy: Secondary | ICD-10-CM | POA: Insufficient documentation

## 2016-04-08 MED ORDER — CEPHALEXIN 500 MG PO CAPS: 500.0000 mg | ORAL_CAPSULE | Freq: Four times a day (QID) | ORAL | Status: DC

## 2016-04-08 MED ORDER — TETANUS-DIPHTH-ACELL PERTUSSIS 5-2.5-18.5 LF-MCG/0.5 IM SUSP: 0.5000 mL | Freq: Once | INTRAMUSCULAR | Status: DC

## 2016-04-08 MED ORDER — LIDOCAINE HCL (PF) 1 % IJ SOLN
INTRAMUSCULAR | Status: AC
  Filled 2016-04-08: qty 5

## 2016-04-08 MED ORDER — TETANUS-DIPHTH-ACELL PERTUSSIS 5-2.5-18.5 LF-MCG/0.5 IM SUSP
0.5000 mL | Freq: Once | INTRAMUSCULAR | Status: AC
  Administered 2016-04-08: 0.5 mL via INTRAMUSCULAR
  Filled 2016-04-08: qty 0.5

## 2016-04-08 MED ORDER — LIDOCAINE HCL (PF) 1 % IJ SOLN
5.0000 mL | Freq: Once | INTRAMUSCULAR | Status: AC
  Administered 2016-04-08: 5 mL via INTRADERMAL
  Filled 2016-04-08: qty 5

## 2016-04-08 NOTE — ED Notes (Signed)
Dropped scissors on top of rt foot this am around 1100,  approx 1 1/4 inch lac to foot  Bleeding controlled

## 2016-04-08 NOTE — ED Notes (Signed)
Patient states that she cut her right foot on a set of scissors this am- laceration noted to her right ankle

## 2016-04-08 NOTE — ED Provider Notes (Signed)
CSN: [id number]     Arrival date & time 04/08/16  1942 History  By signing my name below, I, Majel Homereyton Lee, attest that this documentation has been prepared under the direction and in the presence of Geoffery Lyonsouglas Harrol Novello, MD . Electronically Signed: Majel HomerPeyton Lee, Scribe. 04/08/2016. 8:51 PM   Chief Complaint  Patient presents with  . Foot Pain   Patient is a 70 y.o. female presenting with lower extremity pain. The history is provided by the patient. No language interpreter was used.  Foot Pain This is a new problem. The current episode started 6 to 12 hours ago. The problem occurs constantly. The problem has not changed since onset.Nothing aggravates the symptoms. Nothing relieves the symptoms. She has tried nothing for the symptoms.   HPI Comments:  Earnestine Leysrudy T Waltz is a 70 y.o. female with PMHx of HLD and HTN who presents to the Emergency Department complaining of sudden onset, unchanged, moderate laceration on upper right ankle that occurred around 11am this afternoon. Pt states that she was out in her garden when she dropped large shears on her right ankle. Pt notes pain is worsened with movements of her ankle. Pt denies hx of DM and any other complaints. Pt states her last tetanus shot was ~5-6 years ago.  Past Medical History  Diagnosis Date  . Spinal stenosis   . Scoliosis   . Hypertension   . Arthritis   . High cholesterol   . Glaucoma   . Sleep apnea     intermittently uses cpap ( started 2 weeks ago)  . Kidney cysts     effecting creat.  Marland Kitchen. Headache(784.0)   . History of blood transfusion    Past Surgical History  Procedure Laterality Date  . Back surgery    . Ankle fracture surgery    . Cholecystectomy    . Abdominal hysterectomy    . Finger surgery    . Eye surgery Bilateral     placed duct tubes  . Knee arthroplasty Right 04/29/2014    Procedure: COMPUTER ASSISTED TOTAL KNEE ARTHROPLASTY;  Surgeon: Eldred MangesMark C Yates, MD;  Location: [facility] OR;  Service: Orthopedics;  Laterality: Right;   Cemented Right Total Knee Arthroplasty   History reviewed. No pertinent family history. Social History  Substance Use Topics  . Smoking status: Never Smoker   . Smokeless tobacco: None  . Alcohol Use: Yes     Comment: rare   OB History    No data available     Review of Systems 10 systems reviewed and all are negative for acute change except as noted in the HPI.  Allergies  Review of patient's allergies indicates no known allergies.  Home Medications   Prior to Admission medications   Medication Sig Start Date End Date Taking? Authorizing Provider  diclofenac (CATAFLAM) 50 MG tablet Take 50 mg by mouth 3 (three) times daily.   Yes Historical Provider, MD  furosemide (LASIX) 40 MG tablet Take 20 mg by mouth 2 (two) times daily.   Yes Historical Provider, MD  amLODipine (NORVASC) 10 MG tablet Take 10 mg by mouth at bedtime.    Historical Provider, MD  aspirin EC 325 MG tablet Take 1 tablet (325 mg total) by mouth daily. 04/29/14   Maud DeedSheila Vernon, PA-C  cetirizine (ZYRTEC) 10 MG tablet Take 10 mg by mouth daily.    Historical Provider, MD  denosumab (PROLIA) 60 MG/ML SOLN injection Inject 60 mg into the skin every 6 (six) months. Administer in upper arm, thigh,  or abdomen    Historical Provider, MD  gabapentin (NEURONTIN) 300 MG capsule Take 600 mg by mouth 2 (two) times daily.     Historical Provider, MD  lisinopril-hydrochlorothiazide (PRINZIDE,ZESTORETIC) 20-25 MG per tablet Take 1 tablet by mouth daily.    Historical Provider, MD  methocarbamol (ROBAXIN) 500 MG tablet Take 1 tablet (500 mg total) by mouth every 6 (six) hours as needed for muscle spasms (spasm). 04/29/14   Maud Deed, PA-C  Multiple Vitamins-Minerals (MULTIVITAMIN WITH MINERALS) tablet Take 1 tablet by mouth daily.    Historical Provider, MD  naproxen sodium (ANAPROX) 220 MG tablet Take 440 mg by mouth 2 (two) times daily with a meal.    Historical Provider, MD  oxyCODONE-acetaminophen (ROXICET) 5-325 MG per tablet  Take 1-2 tablets by mouth every 4 (four) hours as needed. 04/29/14   Maud Deed, PA-C  pravastatin (PRAVACHOL) 20 MG tablet Take 20 mg by mouth at bedtime.    Historical Provider, MD  timolol (BETIMOL) 0.5 % ophthalmic solution Place 1 drop into both eyes daily.    Historical Provider, MD  traMADol (ULTRAM) 50 MG tablet Take 50 mg by mouth every 12 (twelve) hours as needed for moderate pain.    Historical Provider, MD   Triage Vitals: BP 141/79 mmHg  Pulse 78  Temp(Src) 98.5 F (36.9 C) (Oral)  Resp 18  Ht  (1.651 m)  Wt 200 lb (90.719 kg)  BMI 33.28 kg/m2  SpO2 100% Physical Exam  Constitutional: She is oriented to person, place, and time. She appears well-developed and well-nourished.  HENT:  Head: Normocephalic and atraumatic.  Eyes: Conjunctivae are normal. Right eye exhibits no discharge. Left eye exhibits no discharge.  Neck: Normal range of motion. Neck supple. No tracheal deviation present.  Cardiovascular: Normal rate and regular rhythm.   Pulmonary/Chest: Effort normal and breath sounds normal.  Abdominal: Soft. She exhibits no distension. There is no tenderness. There is no guarding.  Musculoskeletal: She exhibits no edema.  2.5 cm laceration to the anterior aspect of the right ankle.   Neurological: She is alert and oriented to person, place, and time.  Skin: Skin is warm. No rash noted.  Psychiatric: She has a normal mood and affect.  Nursing note and vitals reviewed.   ED Course  Procedures  DIAGNOSTIC STUDIES:  Oxygen Saturation is 100% on RA, nornal by my interpretation.    COORDINATION OF CARE:  8:49 PM Discussed treatment plan, which includes sutures to the right ankle with pt at bedside and pt agreed to plan.  Labs Review Labs Reviewed - No data to display  Imaging Review No results found. I have personally reviewed and evaluated these images and lab results as part of my medical decision-making.   EKG Interpretation None     LACERATION  REPAIR Performed by: Geoffery Lyons Authorized by: Geoffery Lyons Consent: Verbal consent obtained. Risks and benefits: risks, benefits and alternatives were discussed Consent given by: patient Patient identity confirmed: provided demographic data Prepped and Draped in normal sterile fashion Wound explored  Laceration Location: Right ankle  Laceration Length: 2.5 cm  No Foreign Bodies seen or palpated  Anesthesia: local infiltration  Local anesthetic: lidocaine 1 % without epinephrine  Anesthetic total: 3 ml  Irrigation method: syringe Amount of cleaning: standard  Skin closure: 4-0 Prolene   Number of sutures: 4   Technique: Simple interrupted   Patient tolerance: Patient tolerated the procedure well with no immediate complications.  MDM   Final diagnoses:  None  Laceration repaired as above. Tetanus updated. She will be given a short course of antibiotics as this laceration is nearly 8 hours old. To return as needed for any problems.  I personally performed the services described in this documentation, which was scribed in my presence. The recorded information has been reviewed and is accurate.       Geoffery Lyons, MD 04/08/16 [phone]

## 2016-04-08 NOTE — Discharge Instructions (Signed)
Bacitracin and dressing changes twice daily for the next several days.  Keflex as prescribed.  Sutures are to be removed in 7-10 days. Please follow-up with your primary Dr. for this.  Return to the emergency department if you develop increasing pain, redness, or pus draining from the wound.   Laceration Care, Adult A laceration is a cut that goes through all of the layers of the skin and into the tissue that is right under the skin. Some lacerations heal on their own. Others need to be closed with stitches (sutures), staples, skin adhesive strips, or skin glue. Proper laceration care minimizes the risk of infection and helps the laceration to heal better. HOW TO CARE FOR YOUR LACERATION If sutures or staples were used:  Keep the wound clean and dry.  If you were given a bandage (dressing), you should change it at least one time per day or as told by your health care provider. You should also change it if it becomes wet or dirty.  Keep the wound completely dry for the first 24 hours or as told by your health care provider. After that time, you may shower or bathe. However, make sure that the wound is not soaked in water until after the sutures or staples have been removed.  Clean the wound one time each day or as told by your health care provider:  Wash the wound with soap and water.  Rinse the wound with water to remove all soap.  Pat the wound dry with a clean towel. Do not rub the wound.  After cleaning the wound, apply a thin layer of antibiotic ointmentas told by your health care provider. This will help to prevent infection and keep the dressing from sticking to the wound.  Have the sutures or staples removed as told by your health care provider. If skin adhesive strips were used:  Keep the wound clean and dry.  If you were given a bandage (dressing), you should change it at least one time per day or as told by your health care provider. You should also change it if it  becomes dirty or wet.  Do not get the skin adhesive strips wet. You may shower or bathe, but be careful to keep the wound dry.  If the wound gets wet, pat it dry with a clean towel. Do not rub the wound.  Skin adhesive strips fall off on their own. You may trim the strips as the wound heals. Do not remove skin adhesive strips that are still stuck to the wound. They will fall off in time. If skin glue was used:  Try to keep the wound dry, but you may briefly wet it in the shower or bath. Do not soak the wound in water, such as by swimming.  After you have showered or bathed, gently pat the wound dry with a clean towel. Do not rub the wound.  Do not do any activities that will make you sweat heavily until the skin glue has fallen off on its own.  Do not apply liquid, cream, or ointment medicine to the wound while the skin glue is in place. Using those may loosen the film before the wound has healed.  If you were given a bandage (dressing), you should change it at least one time per day or as told by your health care provider. You should also change it if it becomes dirty or wet.  If a dressing is placed over the wound, be careful not to apply  tape directly over the skin glue. Doing that may cause the glue to be pulled off before the wound has healed.  Do not pick at the glue. The skin glue usually remains in place for 5-10 days, then it falls off of the skin. General Instructions  Take over-the-counter and prescription medicines only as told by your health care provider.  If you were prescribed an antibiotic medicine or ointment, take or apply it as told by your doctor. Do not stop using it even if your condition improves.  To help prevent scarring, make sure to cover your wound with sunscreen whenever you are outside after stitches are removed, after adhesive strips are removed, or when glue remains in place and the wound is healed. Make sure to wear a sunscreen of at least 30 SPF.  Do  not scratch or pick at the wound.  Keep all follow-up visits as told by your health care provider. This is important.  Check your wound every day for signs of infection. Watch for:  Redness, swelling, or pain.  Fluid, blood, or pus.  Raise (elevate) the injured area above the level of your heart while you are sitting or lying down, if possible. SEEK MEDICAL CARE IF:  You received a tetanus shot and you have swelling, severe pain, redness, or bleeding at the injection site.  You have a fever.  A wound that was closed breaks open.  You notice a bad smell coming from your wound or your dressing.  You notice something coming out of the wound, such as wood or glass.  Your pain is not controlled with medicine.  You have increased redness, swelling, or pain at the site of your wound.  You have fluid, blood, or pus coming from your wound.  You notice a change in the color of your skin near your wound.  You need to change the dressing frequently due to fluid, blood, or pus draining from the wound.  You develop a new rash.  You develop numbness around the wound. SEEK IMMEDIATE MEDICAL CARE IF:  You develop severe swelling around the wound.  Your pain suddenly increases and is severe.  You develop painful lumps near the wound or on skin that is anywhere on your body.  You have a red streak going away from your wound.  The wound is on your hand or foot and you cannot properly move a finger or toe.  The wound is on your hand or foot and you notice that your fingers or toes look pale or bluish.   This information is not intended to replace advice given to you by your health care provider. Make sure you discuss any questions you have with your health care provider.   Document Released: 10/25/2005 Document Revised: 03/11/2015 Document Reviewed: 10/21/2014 Elsevier Interactive Patient Education [employer]2016 [employer] Inc[employer]

## 2016-06-08 HISTORY — PX: CERVICAL SPINE SURGERY: SHX589

## 2016-12-14 ENCOUNTER — Ambulatory Visit (INDEPENDENT_AMBULATORY_CARE_PROVIDER_SITE_OTHER): Payer: Self-pay | Admitting: Orthopaedic Surgery

## 2016-12-28 ENCOUNTER — Ambulatory Visit (INDEPENDENT_AMBULATORY_CARE_PROVIDER_SITE_OTHER): Payer: Medicare HMO

## 2016-12-28 ENCOUNTER — Encounter (INDEPENDENT_AMBULATORY_CARE_PROVIDER_SITE_OTHER): Payer: Self-pay | Admitting: Orthopaedic Surgery

## 2016-12-28 ENCOUNTER — Ambulatory Visit (INDEPENDENT_AMBULATORY_CARE_PROVIDER_SITE_OTHER): Payer: Medicare HMO | Admitting: Orthopaedic Surgery

## 2016-12-28 VITALS — BP 132/89 | HR 69

## 2016-12-28 DIAGNOSIS — M4726 Other spondylosis with radiculopathy, lumbar region: Secondary | ICD-10-CM | POA: Diagnosis not present

## 2016-12-28 DIAGNOSIS — G8929 Other chronic pain: Secondary | ICD-10-CM

## 2016-12-28 DIAGNOSIS — M545 Low back pain: Secondary | ICD-10-CM

## 2016-12-28 MED ORDER — METHYLPREDNISOLONE 4 MG PO TABS: ORAL_TABLET | ORAL | 0 refills | Status: DC

## 2016-12-28 NOTE — Progress Notes (Signed)
Office Visit Note   Patient: Joanna Castro           Date of Birth: 1946-01-10           MRN: [id number] Visit Date: 12/28/2016              Requested by: Charlette Caffey, MD [address] [place], [place] [address] PCP: Charlette Caffey, MD   Assessment & Plan: Visit Diagnoses:  1. Chronic bilateral low back pain, with sciatica presence unspecified   2. Other spondylosis with radiculopathy, lumbar region     Plan:  With patient's worsening low back pain we will schedule MRI to rule out HNP/stenosis. Follow-up the office with Dr. Ophelia Charter after completion to discuss results and further treatment options. Did give patient a Medrol Dosepak 6 day taper to be taken. Stop diclofenac while taking the prednisone.  Follow-Up Instructions: Return in about 4 weeks (around 01/25/2017) for Return for review of lumbar spine MRI.   Orders:  Orders Placed This Encounter  Procedures  . XR Lumbar Spine Complete  . MR Lumbar Spine w/o contrast   Meds ordered this encounter  Medications  . methylPREDNISolone (MEDROL) 4 MG tablet    Sig: 6 day taper to be taken as directed    Dispense:  21 tablet    Refill:  0      Procedures: No procedures performed   Clinical Data: No additional findings.   Subjective: Chief Complaint  Patient presents with  . Lower Back - Pain    Patient is here today with low back pain. History of L3-4 right TLIF; bilateral fusion 11/13/10. She states for about one year after a fall she had, she has had quite a bit of pain. She states it is not a constant pain, but intermittent. She does have a lot of pain after sitting for some time. She ambulates with a cane. She also just recently had a surgery last year on her neck at [facility], but Dr. Wyline Mood.   She's been having worsening left-sided low back pain that extends into the left buttock and lateral hip for several months. This is aggravated by a fall that occurred in August 2017 where she tripped in a  parking lot landing on her left side. Status post previous L3-L5 fusion. She also recently had multilevel cervical posterior fusion by Dr. Wyline Mood August 2017. Not currently complaining of any lower extremity numbness tingling weakness. Low back and left hip pain aggravated with bending twisting and lifting or sitting for prolonged periods. She has been taking diclofenac with minimal improvement. Review of Systems  Constitutional: Negative.   HENT: Negative.   Respiratory: Negative.   Genitourinary: Negative.   Musculoskeletal: Positive for back pain and gait problem.  Skin: Negative.   Psychiatric/Behavioral: Negative.      Objective: Vital Signs: BP 132/89 (BP Location: Left Arm, Patient Position: Sitting, Cuff Size: Normal)   Pulse 69   Physical Exam  Constitutional: She is oriented to person, place, and time. No distress.  HENT:  Head: Normocephalic and atraumatic.  Eyes: EOM are normal. Pupils are equal, round, and reactive to light.  Pulmonary/Chest: No respiratory distress.  Musculoskeletal:  Gait antalgic. She has moderate left lumbar paraspinal tenderness. Moderate to marked left sciatic notch and greater trochanter bursal tenderness. Positive left straight leg raise. No focal motor deficits.  Neurological: She is oriented to person, place, and time.    Ortho Exam  Specialty Comments:  No specialty comments available.  Imaging:  No results found.   PMFS History: Patient Active Problem List   Diagnosis Date Noted  . Osteoarthritis of right knee 04/29/2014    Class: Diagnosis of   Past Medical History:  Diagnosis Date  . Arthritis   . Glaucoma   . Headache(784.0)   . High cholesterol   . History of blood transfusion   . Hypertension   . Kidney cysts    effecting creat.  . Scoliosis   . Sleep apnea    intermittently uses cpap ( started 2 weeks ago)  . Spinal stenosis     No family history on file.  Past Surgical History:  Procedure Laterality Date  .  ABDOMINAL HYSTERECTOMY    . ANKLE FRACTURE SURGERY    . BACK SURGERY    . CHOLECYSTECTOMY    . EYE SURGERY Bilateral    placed duct tubes  . FINGER SURGERY    . KNEE ARTHROPLASTY Right 04/29/2014   Procedure: COMPUTER ASSISTED TOTAL KNEE ARTHROPLASTY;  Surgeon: Eldred MangesMark C Yates, MD;  Location: [facility] OR;  Service: Orthopedics;  Laterality: Right;  Cemented Right Total Knee Arthroplasty   Social History   Occupational History  . Not on file.   Social History Main Topics  . Smoking status: Never Smoker  . Smokeless tobacco: Not on file  . Alcohol use Yes     Comment: rare  . Drug use: No  . Sexual activity: Yes    Birth control/ protection: None, Post-menopausal

## 2016-12-28 NOTE — Addendum Note (Signed)
Addended by: Naida SleightWENS, JAMES M on: 12/28/2016 05:01 PM   Modules accepted: Orders

## 2017-01-01 ENCOUNTER — Ambulatory Visit (HOSPITAL_BASED_OUTPATIENT_CLINIC_OR_DEPARTMENT_OTHER)
Admission: RE | Admit: 2017-01-01 | Discharge: 2017-01-01 | Disposition: A | Discharge status: Home / Self Care | Payer: Medicare HMO | Source: Ambulatory Visit | Attending: Surgery | Admitting: Surgery

## 2017-01-01 DIAGNOSIS — Z9889 Other specified postprocedural states: Secondary | ICD-10-CM | POA: Diagnosis not present

## 2017-01-01 DIAGNOSIS — M48061 Spinal stenosis, lumbar region without neurogenic claudication: Secondary | ICD-10-CM | POA: Insufficient documentation

## 2017-01-01 DIAGNOSIS — M4726 Other spondylosis with radiculopathy, lumbar region: Secondary | ICD-10-CM | POA: Insufficient documentation

## 2017-02-01 ENCOUNTER — Ambulatory Visit (INDEPENDENT_AMBULATORY_CARE_PROVIDER_SITE_OTHER): Payer: Medicare HMO | Admitting: Orthopaedic Surgery

## 2017-02-01 ENCOUNTER — Encounter (INDEPENDENT_AMBULATORY_CARE_PROVIDER_SITE_OTHER): Payer: Self-pay | Admitting: Orthopaedic Surgery

## 2017-02-01 VITALS — BP 152/79 | HR 74 | Ht 65.5 in | Wt 190.0 lb

## 2017-02-01 DIAGNOSIS — M48062 Spinal stenosis, lumbar region with neurogenic claudication: Secondary | ICD-10-CM | POA: Diagnosis not present

## 2017-02-01 NOTE — Progress Notes (Signed)
Office Visit Note   Patient: Joanna Castro           Date of Birth: 1946/05/10           MRN: [id number] Visit Date: 02/01/2017              Requested by: Charlette Caffey, MD [address] 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 She wants to wait until July when she left family available to help with her postoperatively so she can stay at home. We'll check her back again in 3 months. She has medical checkup in June. We discussed the operative technique. We did add an additional cage at the T3 level decompression with a left T left and then the add additional pedicle screws in L2 and come them to her previous instrumentation.  Follow-Up Instructions: Return in about 2 months (around 04/03/2017).   Orders:  No orders of the defined types were placed in this encounter.  No orders of the defined types were placed in this encounter.     Procedures: No procedures performed   Clinical Data: No additional findings.   Subjective: Chief Complaint  Patient presents with  . Lower Back - Pain    Patient returns to review MRI Lumbar Spine. She continues to have back pain. She ambulates with a cane. She is taking diclofenac and robaxin as needed.   Patient's been followed by me for more than 15 years. She's had total knee arthroplasty which is doing well. Originally she had L4-5 lumbar fusion 2008 with 9 mm cage and [employer] instrumentation. This was for stenosis and instability. She did well for a number of years and in 2012 developed stenosis at the L3-4 level with her scoliosis and had the L3-4 level decompressed and fused also with a 9 mm lordotic cage and pedicle instrumentation. She now presents 6 years after that with progressive central and foraminal  stenosis bilaterally with more symptoms on the left than right. She uses her cane at all times ambulate. She sometimes has pain when she sits. She has increased pain after standing and with distance. MRI scan is available for review and shows progressive subarticular narrowing with moderate foraminal stenosis which has progressed and worsened foraminal narrowing on the left than right with central stenosis. This is all at the L2-3 level. She has had slight progression at L1-2 with mild bulge but no compression.  Review of Systems   Objective: Vital Signs: BP (!) 152/79   Pulse 74   Ht 5' 5.5" (1.664 m)   Wt 190 lb (86.2 kg)   BMI 31.14 kg/m   Physical Exam  Constitutional: She is oriented to person, place, and time. She appears well-developed.  HENT:  Head: Normocephalic.  Right Ear: External ear normal.  Left Ear: External ear normal.  Eyes: Pupils are equal, round, and reactive to light.  Neck: No tracheal deviation present. No thyromegaly present.  Cardiovascular: Normal rate.   Pulmonary/Chest: Effort normal.  Abdominal: Soft.  Musculoskeletal:  The lumbar incision. She has some decreased sensation anterior thigh worse left than right. She slow to get from sitting to standing she may relate with a cane and has the left lumbosacral pain. No hip flexion weakness. She has some mild quad weakness on the left. Anterior tib gastrocsoleus are strong. Lumbar incisions well-healed.  Neurological: She is alert and oriented to person, place, and time.  Skin: Skin is warm and dry.  Psychiatric: She has a normal mood and affect. Her behavior is normal.    Ortho Exam  Specialty Comments:  No specialty comments available.  Imaging: Show images for MR Lumbar Spine w/o contrast  Study Result   CLINICAL DATA:  Low back pain for 1 year. Lumbar spine surgery 2008 and 2011.  EXAM: MRI LUMBAR SPINE WITHOUT CONTRAST  TECHNIQUE: Multiplanar, multisequence MR imaging of the lumbar spine  was performed. No intravenous contrast was administered.  COMPARISON:  Lumbar myelogram 11/28/2008. Lumbar spine radiographs 11/20/2012.  FINDINGS: Segmentation: 5 non rib-bearing lumbar type vertebral bodies are present.  Alignment: Retrolisthesis at L2-3 has progressed since the myelogram. AP alignment is otherwise anatomic. Rightward curvature is centered at L1-2.  Vertebrae: Chronic endplate marrow changes are more evident on the left at L2-3. Vertebral body heights are maintained.  Conus medullaris: Extends to the L1 level and appears normal.  Paraspinal and other soft tissues: Bilateral renal cystic disease is present. No other focal lesions are present. There is no significant adenopathy.  Disc levels:  T12-L1: Asymmetric left-sided facet hypertrophy has progressed. A broad-based disc protrusion is present. Mild left foraminal narrowing is new.  L1-2: Moderate facet hypertrophy is asymmetric on the left. There is some progression. Mild disc bulging is present. There is no significant stenosis.  L2-3: A broad-based disc protrusion is present. Advanced facet hypertrophy is noted. Progressive subarticular narrowing is evident bilaterally. Moderate foraminal stenosis has progressed as well. Foraminal narrowing is worse left than right.  L3-4: Lumbar fusion is present. A wide laminectomy is noted. No residual or recurrent stenosis is evident.  L4-5: Lumbar fusion is present. A wide laminectomy is noted. No residual or recurrent stenosis is evident.  L5-S1: Asymmetric right-sided facet hypertrophy has progressed. There is no significant stenosis.  IMPRESSION: 1. Lumbar fusion at L3-4 and L4-5 without residual or recurrent stenosis at these levels. 2. Progressive adjacent level disease at L2-3 with now a moderate foraminal stenosis bilaterally, left greater than right. This potentially affects the L2 nerve roots. 3. Moderate subarticular narrowing  bilaterally at L2-3 has progressed as well. 4. Progressive facet arthropathy at L5-S1 without significant stenosis. 5. Progressive facet disease at T12-L1 and L1-2. Mild left foraminal narrowing at T12-L1 is new.   Electronically Signed   By: Marin Roberts M.D.   On: 01/02/2017 07:32        PMFS History: Patient Active Problem List   Diagnosis Date Noted  . Osteoarthritis of right knee 04/29/2014    Class: Diagnosis of   Past Medical History:  Diagnosis Date  . Arthritis   . Glaucoma   . Headache(784.0)   . High cholesterol   . History of blood transfusion   . Hypertension   . Kidney cysts    effecting creat.  . Scoliosis   . Sleep apnea    intermittently uses cpap ( started 2 weeks ago)  . Spinal stenosis     No family history on file.  Past Surgical History:  Procedure Laterality Date  . ABDOMINAL HYSTERECTOMY    . ANKLE FRACTURE SURGERY    . BACK SURGERY    . CHOLECYSTECTOMY    . EYE SURGERY Bilateral    placed duct tubes  . FINGER SURGERY    . KNEE ARTHROPLASTY Right 04/29/2014   Procedure: COMPUTER ASSISTED TOTAL KNEE ARTHROPLASTY;  Surgeon: Eldred Manges, MD;  Location: [facility] OR;  Service: Orthopedics;  Laterality: Right;  Cemented Right Total Knee Arthroplasty   Social History   Occupational History  . Not on file.   Social History Main Topics  . Smoking status: Never Smoker  . Smokeless tobacco: Never Used  . Alcohol use Yes     Comment: rare  . Drug use: No  . Sexual activity: Yes    Birth control/ protection: None, Post-menopausal

## 2017-04-05 ENCOUNTER — Encounter (INDEPENDENT_AMBULATORY_CARE_PROVIDER_SITE_OTHER): Payer: Self-pay | Admitting: Orthopedic Surgery

## 2017-04-05 ENCOUNTER — Ambulatory Visit (INDEPENDENT_AMBULATORY_CARE_PROVIDER_SITE_OTHER): Payer: Medicare HMO | Admitting: Orthopaedic Surgery

## 2017-04-05 ENCOUNTER — Encounter (INDEPENDENT_AMBULATORY_CARE_PROVIDER_SITE_OTHER): Payer: Self-pay | Admitting: Orthopaedic Surgery

## 2017-04-05 VITALS — BP 132/82 | HR 70 | Ht 65.0 in | Wt 190.0 lb

## 2017-04-05 DIAGNOSIS — Z96651 Presence of right artificial knee joint: Secondary | ICD-10-CM | POA: Diagnosis not present

## 2017-04-05 DIAGNOSIS — M48061 Spinal stenosis, lumbar region without neurogenic claudication: Secondary | ICD-10-CM

## 2017-04-05 NOTE — Progress Notes (Signed)
Office Visit Note   Patient: Joanna Castro           Date of Birth: [birth year]           MRN: [id number] Visit Date: 04/05/2017              Requested by: Charlette CaffeyParuchuri, Lakshmi P, MD [address] [place], [place] [address] PCP: Charlette CaffeyParuchuri, Lakshmi P, MD   Assessment & Plan: Visit Diagnoses:  1. Spinal stenosis of lumbar region without neurogenic claudication   2. S/P total knee arthroplasty, right            L2-3 acquired stenosis above solid L3-L5  instrumented fusion. Foraminal stenosis L2-3 worse on the left than right. Bilateral lateral recess stenosis L2-3.  Plan: Patient will require fusion of the L2-3 level above or solid L3-L5 fusion. TLIF cage  on the left side with pedicle instrumentation. We may be able to attach it to her previous rod and not have to remove the previous long rod from L3-L5 on each side. We discussed operative technique she has a brace which is still in good shape and she will bring it with her. Risk of dural tear, pseudoarthrosis, reoperation, paralysis, bleeding, need for transfusion discussed. Plan we'll use Cell Saver to help with her intraoperative blood loss. Expected 2 night stay in the hospital. She'll have a dull with her when she goes home. Questions were elicited and answered. She is familiar with procedures and she is are he had 2 lumbar fusions. Questions were answered she requests we proceed.  Follow-Up Instructions: Patient will schedule surgery late July.  Orders:  No orders of the defined types were placed in this encounter.  No orders of the defined types were placed in this encounter.     Procedures: No procedures performed   Clinical Data: No additional findings.   Subjective: Chief Complaint  Patient presents with  . Lower Back - Pain    HPI progressive neurogenic claudication with L2-3 spinal stenosis by MRI scan. Original surgery L4-5 2008 with [employer] instrumentation. Fusion at the L3-4 level 2012 with cage and  pedicle instrumentation. Both levels healed solidly and she had progressive symptoms stenosis and MRI scan showed progression of L2-3 level stenosis and mild bulge at L1-2 without compression. She has pain when she stands she is ambulatory with a cane. She does better leaning on a grocery cart. She can stand for 10 minutes has to sit down and rest and then can repeat after 10 minutes sitting. She is putting off surgery until late July due to work requirements. She has more numbness on her left leg than right. She denies bowel bladder symptoms. No fever or chills. She still has some neck pain after multilevel decompression and instrumented fusion multiple levels [facility] Dr. Wyline MoodBranch. These neck symptoms are stable. She's been on Neurontin with minimal relief use tramadol for the pain.  Review of Systems 14 point review of systems updated. Positive for previous right total knee arthroplasty doing well. Previous multilevel cervical decomp[facility] and [facility] [place]. Previous instrumented fusion 2008, 2012 at the L4-5 and then L3-4 level respectively. Negative for cardiac respiratory problems. Previous treatment for claudication with cane for ambulation prednisone pack. Positive for hypertension, negative for MI negative CVA, negative for DVT, negative for anesthetic problems,   Objective: Vital Signs: BP 132/82   Pulse 70   Ht 5\' 5"  (1.651 m)   Wt 190 lb (86.2 kg)  BMI 31.62 kg/m   Physical Exam  Constitutional: She is oriented to person, place, and time. She appears well-developed.  HENT:  Head: Normocephalic.  Right Ear: External ear normal.  Left Ear: External ear normal.  Eyes: Pupils are equal, round, and reactive to light.  Neck: No tracheal deviation present. No thyromegaly present.  Cardiovascular: Normal rate.   Pulmonary/Chest: Effort normal.  Abdominal: Soft.  Musculoskeletal:  Patient has some decreased sensation over the anterior  aspect the left thigh. No hip flexion or quad weakness. Anterior tib EHL is strong. Distal pulses are palpable. Negative straight leg raising. She ambulates with a cane. Mild pelvic obliquity due to her scoliosis.  Neurological: She is alert and oriented to person, place, and time.  Skin: Skin is warm and dry.  Psychiatric: She has a normal mood and affect. Her behavior is normal.    Ortho Exam  Specialty Comments:  No specialty comments available.  Imaging: No results found.  Show images for MR Lumbar Spine w/o contrast  Study Result   CLINICAL DATA:  Low back pain for 1 year. Lumbar spine surgery 2008 and 2011.  EXAM: MRI LUMBAR SPINE WITHOUT CONTRAST  TECHNIQUE: Multiplanar, multisequence MR imaging of the lumbar spine was performed. No intravenous contrast was administered.  COMPARISON:  Lumbar myelogram 11/28/2008. Lumbar spine radiographs 11/20/2012.  FINDINGS: Segmentation: 5 non rib-bearing lumbar type vertebral bodies are present.  Alignment: Retrolisthesis at L2-3 has progressed since the myelogram. AP alignment is otherwise anatomic. Rightward curvature is centered at L1-2.  Vertebrae: Chronic endplate marrow changes are more evident on the left at L2-3. Vertebral body heights are maintained.  Conus medullaris: Extends to the L1 level and appears normal.  Paraspinal and other soft tissues: Bilateral renal cystic disease is present. No other focal lesions are present. There is no significant adenopathy.  Disc levels:  T12-L1: Asymmetric left-sided facet hypertrophy has progressed. A broad-based disc protrusion is present. Mild left foraminal narrowing is new.  L1-2: Moderate facet hypertrophy is asymmetric on the left. There is some progression. Mild disc bulging is present. There is no significant stenosis.  L2-3: A broad-based disc protrusion is present. Advanced facet hypertrophy is noted. Progressive subarticular narrowing is  evident bilaterally. Moderate foraminal stenosis has progressed as well. Foraminal narrowing is worse left than right.  L3-4: Lumbar fusion is present. A wide laminectomy is noted. No residual or recurrent stenosis is evident.  L4-5: Lumbar fusion is present. A wide laminectomy is noted. No residual or recurrent stenosis is evident.  L5-S1: Asymmetric right-sided facet hypertrophy has progressed. There is no significant stenosis.  IMPRESSION: 1. Lumbar fusion at L3-4 and L4-5 without residual or recurrent stenosis at these levels. 2. Progressive adjacent level disease at L2-3 with now a moderate foraminal stenosis bilaterally, left greater than right. This potentially affects the L2 nerve roots. 3. Moderate subarticular narrowing bilaterally at L2-3 has progressed as well. 4. Progressive facet arthropathy at L5-S1 without significant stenosis. 5. Progressive facet disease at T12-L1 and L1-2. Mild left foraminal narrowing at T12-L1 is new.   Electronically Signed   By: Marin Roberts M.D.   On: 01/02/2017 07:32      PMFS History: There are no active problems to display for this patient.  Past Medical History:  Diagnosis Date  . Arthritis   . Glaucoma   . Headache(784.0)   . High cholesterol   . History of blood transfusion   . Hypertension   . Kidney cysts    effecting creat.  Marland Kitchen  Scoliosis   . Sleep apnea    intermittently uses cpap ( started 2 weeks ago)  . Spinal stenosis     No family history on file.  Past Surgical History:  Procedure Laterality Date  . ABDOMINAL HYSTERECTOMY    . ANKLE FRACTURE SURGERY    . BACK SURGERY    . CHOLECYSTECTOMY    . EYE SURGERY Bilateral    placed duct tubes  . FINGER SURGERY    . KNEE ARTHROPLASTY Right 04/29/2014   Procedure: COMPUTER ASSISTED TOTAL KNEE ARTHROPLASTY;  Surgeon: Eldred Manges, MD;  Location: [facility] OR;  Service: Orthopedics;  Laterality: Right;  Cemented Right Total Knee Arthroplasty   Social  History   Occupational History  . Not on file.   Social History Main Topics  . Smoking status: Never Smoker  . Smokeless tobacco: Never Used  . Alcohol use Yes     Comment: rare  . Drug use: No  . Sexual activity: Yes    Birth control/ protection: None, Post-menopausal

## 2017-05-02 ENCOUNTER — Other Ambulatory Visit (INDEPENDENT_AMBULATORY_CARE_PROVIDER_SITE_OTHER): Payer: Self-pay | Admitting: Orthopaedic Surgery

## 2017-05-02 DIAGNOSIS — M48061 Spinal stenosis, lumbar region without neurogenic claudication: Secondary | ICD-10-CM

## 2017-05-18 ENCOUNTER — Ambulatory Visit (HOSPITAL_COMMUNITY)
Admission: RE | Admit: 2017-05-18 | Discharge: 2017-05-18 | Disposition: A | Discharge status: Home / Self Care | Payer: Medicare HMO | Source: Ambulatory Visit | Attending: Orthopaedic Surgery | Admitting: Orthopaedic Surgery

## 2017-05-18 ENCOUNTER — Encounter (HOSPITAL_COMMUNITY)
Admission: RE | Admit: 2017-05-18 | Discharge: 2017-05-18 | Disposition: A | Discharge status: Home / Self Care | Payer: Medicare HMO | Source: Ambulatory Visit | Attending: Orthopaedic Surgery | Admitting: Orthopaedic Surgery

## 2017-05-18 ENCOUNTER — Encounter (INDEPENDENT_AMBULATORY_CARE_PROVIDER_SITE_OTHER): Payer: Self-pay | Admitting: Orthopedic Surgery

## 2017-05-18 ENCOUNTER — Encounter (HOSPITAL_COMMUNITY): Payer: Self-pay

## 2017-05-18 DIAGNOSIS — Z01818 Encounter for other preprocedural examination: Secondary | ICD-10-CM | POA: Diagnosis present

## 2017-05-18 DIAGNOSIS — M48061 Spinal stenosis, lumbar region without neurogenic claudication: Secondary | ICD-10-CM | POA: Insufficient documentation

## 2017-05-18 HISTORY — DX: Fibromyalgia: M79.7

## 2017-05-18 LAB — CBC
HCT: 41 % (ref 36.0–46.0)
Hemoglobin: 13.5 g/dL (ref 12.0–15.0)
MCH: 27.4 pg (ref 26.0–34.0)
MCHC: 32.9 g/dL (ref 30.0–36.0)
MCV: 83.3 fL (ref 78.0–100.0)
PLATELETS: 271 10*3/uL (ref 150–400)
RBC: 4.92 MIL/uL (ref 3.87–5.11)
RDW: 13.7 % (ref 11.5–15.5)
WBC: 6.7 10*3/uL (ref 4.0–10.5)

## 2017-05-18 LAB — COMPREHENSIVE METABOLIC PANEL
ALBUMIN: 4.6 g/dL (ref 3.5–5.0)
ALT: 22 U/L (ref 14–54)
ANION GAP: 10 (ref 5–15)
AST: 27 U/L (ref 15–41)
Alkaline Phosphatase: 75 U/L (ref 38–126)
BUN: 24 mg/dL — AB (ref 6–20)
CO2: 26 mmol/L (ref 22–32)
Calcium: 9.2 mg/dL (ref 8.9–10.3)
Chloride: 100 mmol/L — ABNORMAL LOW (ref 101–111)
Creatinine, Ser: 1.36 mg/dL — ABNORMAL HIGH (ref 0.44–1.00)
GFR calc Af Amer: 44 mL/min — ABNORMAL LOW (ref >60)
GFR, EST NON AFRICAN AMERICAN: 38 mL/min — AB (ref >60)
Glucose, Bld: 89 mg/dL (ref 65–99)
POTASSIUM: 2.7 mmol/L — AB (ref 3.5–5.1)
Sodium: 136 mmol/L (ref 135–145)
Total Bilirubin: 1.1 mg/dL (ref 0.3–1.2)
Total Protein: 7.3 g/dL (ref 6.5–8.1)

## 2017-05-18 LAB — PROTIME-INR
INR: 0.96
PROTHROMBIN TIME: 12.8 s (ref 11.4–15.2)

## 2017-05-18 LAB — SURGICAL PCR SCREEN
MRSA, PCR: NEGATIVE
Staphylococcus aureus: NEGATIVE

## 2017-05-18 LAB — TYPE AND SCREEN
ABO/RH(D): O POS
ANTIBODY SCREEN: NEGATIVE

## 2017-05-18 NOTE — Progress Notes (Addendum)
PCP:Dr. Pharchari @ [facility] in  [address] [place]--will request sleep study/notes/ekg  Pt. Spilled urine specimen, unable to collect. Left  message with Consuello Bossierheryl Bennett, at Dr. Bonnita HollowYate's office if pt. Needs  to come back in for the ua or can we get it the day of surgery.

## 2017-05-18 NOTE — Progress Notes (Signed)
Anesthesia Chart Review:   K 2.7.  I notified Nelva Bushorma at [facility].  That office is to f/u with pt and treat.  I've asked for a call back confirming this. I sent Elnita MaxwellCheryl in Dr. Ophelia CharterYates' office a staff message as well.   Rica Mastngela Helia Haese, FNP-BC [facility]/Anesthesiology Phone: [phone] 05/18/2017 1:28 PM

## 2017-05-18 NOTE — Pre-Procedure Instructions (Signed)
Joanna Castro  05/18/2017      [id number] - [place], [place] - [address] [place] [id number] Phone: [phone] Fax: [phone]    Your procedure is scheduled on Mon. July 23  [facility] Admitting at 10:30 A.M.  Call this number if you have problems the morning of surgery:  [phone]   Remember:  Do not eat food or drink liquids after midnight on Sun. July 22   Take these medicines the morning of surgery with A SIP OF WATER :amlodipine(norvasc), zyrtec, flonase nasal spray if needed, gabapentin(neurontin) methocarbamol(robaxin) if needed, eye drops            1 week prior to surgery stop: advil, motrin, ibuprofen, aleve, diclofenac(cataflam), Bc Powders, Goody's, vitamins/herbal medicines.   Do not wear jewelry, make-up or nail polish.  Do not wear lotions, powders, or perfumes, or deoderant.  Do not shave 48 hours prior to surgery.  Men may shave face and neck.  Do not bring valuables to the hospital.  [facility] is not responsible for any belongings or valuables.  Contacts, dentures or bridgework may not be worn into surgery.  Leave your suitcase in the car.  After surgery it may be brought to your room.  For patients admitted to the hospital, discharge time will be determined by your treatment team.  Patients discharged the day of surgery will not be allowed to drive home.    Special instructions:   - Preparing For Surgery  Before surgery, you can play an important role. Because skin is not sterile, your skin needs to be as free of germs as possible. You can reduce the number of germs on your skin by washing with CHG (chlorahexidine gluconate) Soap before surgery.  CHG is an antiseptic cleaner which kills germs and bonds with the skin to continue killing germs even after washing.  Please do not use if you have an allergy to CHG or antibacterial soaps. If your skin becomes  reddened/irritated stop using the CHG.  Do not shave (including legs and underarms) for at least 48 hours prior to first CHG shower. It is OK to shave your face.  Please follow these instructions carefully.   1. Shower the NIGHT BEFORE SURGERY and the MORNING OF SURGERY with CHG.   2. If you chose to wash your hair, wash your hair first as usual with your normal shampoo.  3. After you shampoo, rinse your hair and body thoroughly to remove the shampoo.  4. Use CHG as you would any other liquid soap. You can apply CHG directly to the skin and wash gently with a scrungie or a clean washcloth.   5. Apply the CHG Soap to your body ONLY FROM THE NECK DOWN.  Do not use on open wounds or open sores. Avoid contact with your eyes, ears, mouth and genitals (private parts). Wash genitals (private parts) with your normal soap.  6. Wash thoroughly, paying special attention to the area where your surgery will be performed.  7. Thoroughly rinse your body with warm water from the neck down.  8. DO NOT shower/wash with your normal soap after using and rinsing off the CHG Soap.  9. Pat yourself dry with a CLEAN TOWEL.   10. Wear CLEAN PAJAMAS   11. Place CLEAN SHEETS on your bed the night of your first shower and DO NOT SLEEP WITH PETS.    Day of Surgery:  Do not apply any deodorants/lotions. Please wear clean clothes to the hospital/surgery center.      Please read over the following fact sheets that you were given. Coughing and Deep Breathing, MRSA Information and Surgical Site Infection Prevention

## 2017-05-23 NOTE — Progress Notes (Signed)
Anesthesia Chart Review:  Pt is a 71 year old female scheduled for left L2-3 transforminal lumber interbody fusion, pedicle instrumentation, cage on 05/30/2017 with Annell GreeningMark Yates, MD  - Receives primary care at [facility].   PMH includes:  HTN, hyperlipidemia, OSA, glaucoma, scoliosis, cystic disease of kidneys. Never smoker. BMI 32. S/p cervical laminectomy 06/14/16 [facility] S/p R TKA 04/29/14  Medications include: Amlodipine, Lasix, metolazone, pravastatin  Preoperative labs reviewed. - UA will be obtained DOS.  - K 2.7.  I spoke with Dondra [facility]ominique [facility].  They will contact pt and have her hold metolazone, start KCl 20meq BID.  I notified Elnita MaxwellCheryl in Dr. Ophelia CharterYates' office of low K. Will recheck DOS.   CXR 05/18/17: No acute abnormalities.  EKG 05/18/17: NSR  If labs acceptable DOS, I anticipate pt can proceed as scheduled.   Rica Mastngela Kionna Brier, FNP-BC [facility]/Anesthesiology Phone: [phone] 05/23/2017 11:06 AM

## 2017-05-27 MED ORDER — CEFAZOLIN SODIUM-DEXTROSE 2-4 GM/100ML-% IV SOLN
2.0000 g | INTRAVENOUS | Status: AC
  Administered 2017-05-30: 2 g via INTRAVENOUS
  Filled 2017-05-27: qty 100

## 2017-05-30 ENCOUNTER — Inpatient Hospital Stay (HOSPITAL_COMMUNITY)
Admission: RE | Admit: 2017-05-30 | Discharge: 2017-06-03 | DRG: 460 | Disposition: A | Discharge status: Home / Self Care | Payer: Medicare HMO | Source: Ambulatory Visit | Attending: Orthopaedic Surgery | Admitting: Orthopaedic Surgery

## 2017-05-30 ENCOUNTER — Encounter (HOSPITAL_COMMUNITY)
Admission: RE | Disposition: A | Discharge status: Home / Self Care | Payer: Self-pay | Source: Ambulatory Visit | Attending: Orthopaedic Surgery

## 2017-05-30 ENCOUNTER — Inpatient Hospital Stay (HOSPITAL_COMMUNITY): Payer: Medicare HMO | Admitting: Emergency Medicine

## 2017-05-30 ENCOUNTER — Encounter (HOSPITAL_COMMUNITY): Payer: Self-pay | Admitting: Urology

## 2017-05-30 ENCOUNTER — Inpatient Hospital Stay (HOSPITAL_COMMUNITY): Payer: Medicare HMO

## 2017-05-30 DIAGNOSIS — Z419 Encounter for procedure for purposes other than remedying health state, unspecified: Secondary | ICD-10-CM

## 2017-05-30 DIAGNOSIS — M48062 Spinal stenosis, lumbar region with neurogenic claudication: Secondary | ICD-10-CM | POA: Diagnosis present

## 2017-05-30 DIAGNOSIS — Z981 Arthrodesis status: Secondary | ICD-10-CM

## 2017-05-30 DIAGNOSIS — M797 Fibromyalgia: Secondary | ICD-10-CM | POA: Diagnosis present

## 2017-05-30 DIAGNOSIS — G4730 Sleep apnea, unspecified: Secondary | ICD-10-CM | POA: Diagnosis present

## 2017-05-30 DIAGNOSIS — R339 Retention of urine, unspecified: Secondary | ICD-10-CM | POA: Diagnosis not present

## 2017-05-30 DIAGNOSIS — I10 Essential (primary) hypertension: Secondary | ICD-10-CM | POA: Diagnosis present

## 2017-05-30 DIAGNOSIS — H409 Unspecified glaucoma: Secondary | ICD-10-CM | POA: Diagnosis present

## 2017-05-30 DIAGNOSIS — M4327 Fusion of spine, lumbosacral region: Secondary | ICD-10-CM | POA: Diagnosis not present

## 2017-05-30 DIAGNOSIS — R338 Other retention of urine: Secondary | ICD-10-CM

## 2017-05-30 DIAGNOSIS — Z96651 Presence of right artificial knee joint: Secondary | ICD-10-CM | POA: Diagnosis present

## 2017-05-30 DIAGNOSIS — G9741 Accidental puncture or laceration of dura during a procedure: Secondary | ICD-10-CM | POA: Diagnosis not present

## 2017-05-30 DIAGNOSIS — M5416 Radiculopathy, lumbar region: Secondary | ICD-10-CM | POA: Diagnosis present

## 2017-05-30 DIAGNOSIS — M4807 Spinal stenosis, lumbosacral region: Secondary | ICD-10-CM

## 2017-05-30 DIAGNOSIS — N9989 Other postprocedural complications and disorders of genitourinary system: Secondary | ICD-10-CM

## 2017-05-30 DIAGNOSIS — M1990 Unspecified osteoarthritis, unspecified site: Secondary | ICD-10-CM | POA: Diagnosis present

## 2017-05-30 DIAGNOSIS — Y92234 Operating room of hospital as the place of occurrence of the external cause: Secondary | ICD-10-CM | POA: Diagnosis not present

## 2017-05-30 DIAGNOSIS — M48061 Spinal stenosis, lumbar region without neurogenic claudication: Secondary | ICD-10-CM

## 2017-05-30 DIAGNOSIS — M8580 Other specified disorders of bone density and structure, unspecified site: Secondary | ICD-10-CM | POA: Diagnosis present

## 2017-05-30 DIAGNOSIS — Y838 Other surgical procedures as the cause of abnormal reaction of the patient, or of later complication, without mention of misadventure at the time of the procedure: Secondary | ICD-10-CM | POA: Diagnosis not present

## 2017-05-30 DIAGNOSIS — M419 Scoliosis, unspecified: Secondary | ICD-10-CM | POA: Diagnosis present

## 2017-05-30 LAB — URINALYSIS, ROUTINE W REFLEX MICROSCOPIC
Bacteria, UA: NONE SEEN
Bilirubin Urine: NEGATIVE
GLUCOSE, UA: NEGATIVE mg/dL
Hgb urine dipstick: NEGATIVE
Ketones, ur: NEGATIVE mg/dL
Nitrite: NEGATIVE
PH: 7 (ref 5.0–8.0)
PROTEIN: 30 mg/dL — AB
Specific Gravity, Urine: 1.017 (ref 1.005–1.030)

## 2017-05-30 LAB — BASIC METABOLIC PANEL
Anion gap: 9 (ref 5–15)
BUN: 10 mg/dL (ref 6–20)
CO2: 22 mmol/L (ref 22–32)
CREATININE: 0.93 mg/dL (ref 0.44–1.00)
Calcium: 8.5 mg/dL — ABNORMAL LOW (ref 8.9–10.3)
Chloride: 110 mmol/L (ref 101–111)
GFR calc Af Amer: >60 mL/min (ref >60)
GFR, EST NON AFRICAN AMERICAN: 60 mL/min — AB (ref >60)
Glucose, Bld: 189 mg/dL — ABNORMAL HIGH (ref 65–99)
Potassium: 4.1 mmol/L (ref 3.5–5.1)
SODIUM: 141 mmol/L (ref 135–145)

## 2017-05-30 LAB — POCT I-STAT 4, (NA,K, GLUC, HGB,HCT)
Glucose, Bld: 111 mg/dL — ABNORMAL HIGH (ref 65–99)
HEMATOCRIT: 37 % (ref 36.0–46.0)
Hemoglobin: 12.6 g/dL (ref 12.0–15.0)
Potassium: 4.5 mmol/L (ref 3.5–5.1)
Sodium: 141 mmol/L (ref 135–145)

## 2017-05-30 LAB — HEMOGLOBIN AND HEMATOCRIT, BLOOD
HEMATOCRIT: 31.3 % — AB (ref 36.0–46.0)
Hemoglobin: 10.2 g/dL — ABNORMAL LOW (ref 12.0–15.0)

## 2017-05-30 SURGERY — POSTERIOR LUMBAR FUSION 1 LEVEL
Anesthesia: General

## 2017-05-30 MED ORDER — FENTANYL CITRATE (PF) 250 MCG/5ML IJ SOLN
INTRAMUSCULAR | Status: AC
  Filled 2017-05-30: qty 5

## 2017-05-30 MED ORDER — POLYETHYLENE GLYCOL 3350 17 G PO PACK: 17.0000 g | PACK | Freq: Every day | ORAL | Status: DC | PRN

## 2017-05-30 MED ORDER — OXYCODONE HCL ER 10 MG PO T12A
10.0000 mg | EXTENDED_RELEASE_TABLET | Freq: Two times a day (BID) | ORAL | Status: DC
  Administered 2017-05-30 – 2017-06-03 (×5): 10 mg via ORAL
  Filled 2017-05-30 (×6): qty 1

## 2017-05-30 MED ORDER — ROCURONIUM BROMIDE 100 MG/10ML IV SOLN
INTRAVENOUS | Status: DC | PRN
  Administered 2017-05-30: 20 mg via INTRAVENOUS
  Administered 2017-05-30: 80 mg via INTRAVENOUS
  Administered 2017-05-30: 20 mg via INTRAVENOUS
  Administered 2017-05-30: 10 mg via INTRAVENOUS
  Administered 2017-05-30: 200 mg via INTRAVENOUS

## 2017-05-30 MED ORDER — SODIUM CHLORIDE 0.9% FLUSH
3.0000 mL | Freq: Two times a day (BID) | INTRAVENOUS | Status: DC
  Administered 2017-05-30 – 2017-06-03 (×6): 3 mL via INTRAVENOUS

## 2017-05-30 MED ORDER — METHOCARBAMOL 750 MG PO TABS
750.0000 mg | ORAL_TABLET | Freq: Three times a day (TID) | ORAL | Status: DC | PRN
  Filled 2017-05-30: qty 1

## 2017-05-30 MED ORDER — LORATADINE 10 MG PO TABS
10.0000 mg | ORAL_TABLET | Freq: Every day | ORAL | Status: DC
  Administered 2017-05-31 – 2017-06-03 (×3): 10 mg via ORAL
  Filled 2017-05-30 (×4): qty 1

## 2017-05-30 MED ORDER — LIDOCAINE HCL (CARDIAC) 20 MG/ML IV SOLN
INTRAVENOUS | Status: DC | PRN
  Administered 2017-05-30: 80 mg via INTRAVENOUS

## 2017-05-30 MED ORDER — POTASSIUM CHLORIDE IN NACL 20-0.45 MEQ/L-% IV SOLN
INTRAVENOUS | Status: DC
  Administered 2017-05-31: 100 mL via INTRAVENOUS
  Administered 2017-05-31: 100 mL/h via INTRAVENOUS
  Administered 2017-05-31 – 2017-06-01 (×2): via INTRAVENOUS
  Filled 2017-05-30 (×5): qty 1000

## 2017-05-30 MED ORDER — FLEET ENEMA 7-19 GM/118ML RE ENEM: 1.0000 | ENEMA | Freq: Once | RECTAL | Status: DC | PRN

## 2017-05-30 MED ORDER — MIDAZOLAM HCL 5 MG/5ML IJ SOLN
INTRAMUSCULAR | Status: DC | PRN
Start: 2017-05-30 — End: 2017-05-31
  Administered 2017-05-30: 1 mg via INTRAVENOUS

## 2017-05-30 MED ORDER — SUFENTANIL CITRATE 50 MCG/ML IV SOLN
INTRAVENOUS | Status: AC
  Filled 2017-05-30: qty 1

## 2017-05-30 MED ORDER — PROMETHAZINE HCL 25 MG/ML IJ SOLN: 6.2500 mg | INTRAMUSCULAR | Status: DC | PRN

## 2017-05-30 MED ORDER — ONDANSETRON HCL 4 MG/2ML IJ SOLN
INTRAMUSCULAR | Status: DC | PRN
  Administered 2017-05-30: 4 mg via INTRAVENOUS

## 2017-05-30 MED ORDER — HYDROMORPHONE HCL 1 MG/ML IJ SOLN
INTRAMUSCULAR | Status: AC
  Filled 2017-05-30: qty 1

## 2017-05-30 MED ORDER — LACTATED RINGERS IV SOLN
INTRAVENOUS | Status: DC
  Administered 2017-05-30 (×2): via INTRAVENOUS

## 2017-05-30 MED ORDER — PHENYLEPHRINE 40 MCG/ML (10ML) SYRINGE FOR IV PUSH (FOR BLOOD PRESSURE SUPPORT)
PREFILLED_SYRINGE | INTRAVENOUS | Status: AC
  Filled 2017-05-30: qty 10

## 2017-05-30 MED ORDER — SODIUM CHLORIDE 0.9% FLUSH: 3.0000 mL | INTRAVENOUS | Status: DC | PRN

## 2017-05-30 MED ORDER — ONDANSETRON HCL 4 MG PO TABS: 4.0000 mg | ORAL_TABLET | Freq: Four times a day (QID) | ORAL | Status: DC | PRN

## 2017-05-30 MED ORDER — FLUTICASONE PROPIONATE 50 MCG/ACT NA SUSP
1.0000 | Freq: Every evening | NASAL | Status: DC | PRN
  Filled 2017-05-30: qty 16

## 2017-05-30 MED ORDER — SODIUM CHLORIDE 0.9 % IV SOLN: 250.0000 mL | INTRAVENOUS | Status: DC

## 2017-05-30 MED ORDER — PROPOFOL 10 MG/ML IV BOLUS
INTRAVENOUS | Status: DC | PRN
  Administered 2017-05-30: 130 mg via INTRAVENOUS

## 2017-05-30 MED ORDER — ONDANSETRON HCL 4 MG/2ML IJ SOLN
INTRAMUSCULAR | Status: AC
  Filled 2017-05-30: qty 2

## 2017-05-30 MED ORDER — SUGAMMADEX SODIUM 200 MG/2ML IV SOLN
INTRAVENOUS | Status: AC
  Filled 2017-05-30: qty 2

## 2017-05-30 MED ORDER — PRAVASTATIN SODIUM 20 MG PO TABS
20.0000 mg | ORAL_TABLET | Freq: Every day | ORAL | Status: DC
  Administered 2017-05-30 – 2017-06-02 (×4): 20 mg via ORAL
  Filled 2017-05-30 (×4): qty 1

## 2017-05-30 MED ORDER — DEXAMETHASONE SODIUM PHOSPHATE 10 MG/ML IJ SOLN
INTRAMUSCULAR | Status: AC
  Filled 2017-05-30: qty 1

## 2017-05-30 MED ORDER — HYDROMORPHONE HCL 1 MG/ML IJ SOLN: 0.2500 mg | INTRAMUSCULAR | Status: DC | PRN

## 2017-05-30 MED ORDER — OXYCODONE HCL 5 MG PO TABS
5.0000 mg | ORAL_TABLET | ORAL | Status: DC | PRN
  Administered 2017-05-31: 5 mg via ORAL
  Filled 2017-05-30: qty 1

## 2017-05-30 MED ORDER — HYDROMORPHONE HCL 1 MG/ML IJ SOLN
0.2500 mg | INTRAMUSCULAR | Status: DC | PRN
  Administered 2017-05-30 (×3): 0.5 mg via INTRAVENOUS

## 2017-05-30 MED ORDER — CEFAZOLIN SODIUM-DEXTROSE 1-4 GM/50ML-% IV SOLN
1.0000 g | Freq: Three times a day (TID) | INTRAVENOUS | Status: AC
  Administered 2017-05-30 – 2017-05-31 (×2): 1 g via INTRAVENOUS
  Filled 2017-05-30 (×2): qty 50

## 2017-05-30 MED ORDER — FENTANYL CITRATE (PF) 100 MCG/2ML IJ SOLN
INTRAMUSCULAR | Status: DC | PRN
  Administered 2017-05-30 (×5): 50 ug via INTRAVENOUS
  Administered 2017-05-30: 100 ug via INTRAVENOUS

## 2017-05-30 MED ORDER — LACTATED RINGERS IV SOLN
INTRAVENOUS | Status: DC | PRN
  Administered 2017-05-30: 14:00:00 via INTRAVENOUS

## 2017-05-30 MED ORDER — HYDROMORPHONE HCL 1 MG/ML IJ SOLN
1.0000 mg | INTRAMUSCULAR | Status: DC | PRN
  Administered 2017-05-30 – 2017-06-01 (×9): 1 mg via INTRAVENOUS
  Filled 2017-05-30 (×11): qty 1

## 2017-05-30 MED ORDER — GABAPENTIN 300 MG PO CAPS
600.0000 mg | ORAL_CAPSULE | Freq: Two times a day (BID) | ORAL | Status: DC
  Administered 2017-05-30 – 2017-06-03 (×7): 600 mg via ORAL
  Filled 2017-05-30 (×8): qty 2

## 2017-05-30 MED ORDER — METOLAZONE 5 MG PO TABS
5.0000 mg | ORAL_TABLET | ORAL | Status: DC
  Administered 2017-05-31 – 2017-06-02 (×2): 5 mg via ORAL
  Filled 2017-05-30 (×2): qty 1

## 2017-05-30 MED ORDER — BUPIVACAINE HCL (PF) 0.25 % IJ SOLN
INTRAMUSCULAR | Status: AC
  Filled 2017-05-30: qty 30

## 2017-05-30 MED ORDER — ONDANSETRON HCL 4 MG/2ML IJ SOLN: 4.0000 mg | Freq: Four times a day (QID) | INTRAMUSCULAR | Status: DC | PRN

## 2017-05-30 MED ORDER — OXYCODONE HCL 5 MG PO TABS: 5.0000 mg | ORAL_TABLET | Freq: Once | ORAL | Status: DC | PRN

## 2017-05-30 MED ORDER — ALBUMIN HUMAN 5 % IV SOLN
INTRAVENOUS | Status: DC | PRN
  Administered 2017-05-30 (×3): via INTRAVENOUS

## 2017-05-30 MED ORDER — TIMOLOL MALEATE 0.5 % OP SOLN
1.0000 [drp] | Freq: Two times a day (BID) | OPHTHALMIC | Status: DC
  Administered 2017-05-31 – 2017-06-03 (×6): 1 [drp] via OPHTHALMIC
  Filled 2017-05-30: qty 5

## 2017-05-30 MED ORDER — OXYCODONE HCL 5 MG/5ML PO SOLN: 5.0000 mg | Freq: Once | ORAL | Status: DC | PRN

## 2017-05-30 MED ORDER — PROPOFOL 10 MG/ML IV BOLUS
INTRAVENOUS | Status: AC
  Filled 2017-05-30: qty 20

## 2017-05-30 MED ORDER — ROCURONIUM BROMIDE 10 MG/ML (PF) SYRINGE
PREFILLED_SYRINGE | INTRAVENOUS | Status: AC
  Filled 2017-05-30: qty 5

## 2017-05-30 MED ORDER — BISACODYL 10 MG RE SUPP: 10.0000 mg | Freq: Every day | RECTAL | Status: DC | PRN

## 2017-05-30 MED ORDER — SODIUM CHLORIDE 0.9 % IV SOLN
INTRAVENOUS | Status: DC | PRN
  Administered 2017-05-30: 17:00:00 via INTRAVENOUS

## 2017-05-30 MED ORDER — FUROSEMIDE 20 MG PO TABS
20.0000 mg | ORAL_TABLET | Freq: Two times a day (BID) | ORAL | Status: DC
  Administered 2017-06-02 – 2017-06-03 (×2): 20 mg via ORAL
  Filled 2017-05-30 (×4): qty 1

## 2017-05-30 MED ORDER — LACTATED RINGERS IV SOLN
INTRAVENOUS | Status: DC | PRN
  Administered 2017-05-30 (×2): via INTRAVENOUS

## 2017-05-30 MED ORDER — CHLORHEXIDINE GLUCONATE 4 % EX LIQD: 60.0000 mL | Freq: Once | CUTANEOUS | Status: DC

## 2017-05-30 MED ORDER — DEXAMETHASONE SODIUM PHOSPHATE 10 MG/ML IJ SOLN
INTRAMUSCULAR | Status: DC | PRN
  Administered 2017-05-30: 10 mg via INTRAVENOUS

## 2017-05-30 MED ORDER — PHENYLEPHRINE HCL 10 MG/ML IJ SOLN
INTRAMUSCULAR | Status: DC | PRN
  Administered 2017-05-30: 10 ug/min via INTRAVENOUS

## 2017-05-30 MED ORDER — DOCUSATE SODIUM 100 MG PO CAPS
100.0000 mg | ORAL_CAPSULE | Freq: Two times a day (BID) | ORAL | Status: DC
  Administered 2017-05-30 – 2017-06-03 (×7): 100 mg via ORAL
  Filled 2017-05-30 (×8): qty 1

## 2017-05-30 MED ORDER — PHENOL 1.4 % MT LIQD: 1.0000 | OROMUCOSAL | Status: DC | PRN

## 2017-05-30 MED ORDER — LIDOCAINE HCL (CARDIAC) 20 MG/ML IV SOLN
INTRAVENOUS | Status: AC
  Filled 2017-05-30: qty 5

## 2017-05-30 MED ORDER — MENTHOL 3 MG MT LOZG: 1.0000 | LOZENGE | OROMUCOSAL | Status: DC | PRN

## 2017-05-30 MED ORDER — ADULT MULTIVITAMIN W/MINERALS CH
1.0000 | ORAL_TABLET | Freq: Every day | ORAL | Status: DC
  Administered 2017-05-31 – 2017-06-03 (×3): 1 via ORAL
  Filled 2017-05-30 (×4): qty 1

## 2017-05-30 MED ORDER — SODIUM CHLORIDE 0.9 % IJ SOLN
INTRAMUSCULAR | Status: AC
  Filled 2017-05-30: qty 10

## 2017-05-30 MED ORDER — THROMBIN 20000 UNITS EX SOLR
CUTANEOUS | Status: AC
  Filled 2017-05-30: qty 20000

## 2017-05-30 MED ORDER — PHENYLEPHRINE 40 MCG/ML (10ML) SYRINGE FOR IV PUSH (FOR BLOOD PRESSURE SUPPORT)
PREFILLED_SYRINGE | INTRAVENOUS | Status: DC | PRN
  Administered 2017-05-30 (×4): 40 ug via INTRAVENOUS

## 2017-05-30 MED ORDER — MIDAZOLAM HCL 2 MG/2ML IJ SOLN
INTRAMUSCULAR | Status: AC
  Filled 2017-05-30: qty 2

## 2017-05-30 MED ORDER — AMLODIPINE BESYLATE 10 MG PO TABS
10.0000 mg | ORAL_TABLET | Freq: Every day | ORAL | Status: DC
  Administered 2017-05-30 – 2017-06-02 (×4): 10 mg via ORAL
  Filled 2017-05-30 (×4): qty 1

## 2017-05-30 MED ORDER — HYDROMORPHONE HCL 1 MG/ML IJ SOLN
INTRAMUSCULAR | Status: AC
  Filled 2017-05-30: qty 0.5

## 2017-05-30 MED ORDER — MEPERIDINE HCL 25 MG/ML IJ SOLN: 6.2500 mg | INTRAMUSCULAR | Status: DC | PRN

## 2017-05-30 MED ORDER — MIDAZOLAM HCL 2 MG/2ML IJ SOLN: 0.5000 mg | Freq: Once | INTRAMUSCULAR | Status: DC | PRN

## 2017-05-30 SURGICAL SUPPLY — 73 items
ADH SKN CLS APL DERMABOND .7 (GAUZE/BANDAGES/DRESSINGS) ×1
BLADE CLIPPER SURG (BLADE) IMPLANT
BUR ROUND FLUTED 4 SOFT TCH (BURR) ×2 IMPLANT
BUR ROUND FLUTED 4MM SOFT TCH (BURR) ×1
CAP SPINAL LOCKING TI (Cap) ×8 IMPLANT
CLOSURE STERI-STRIP 1/2X4 (GAUZE/BANDAGES/DRESSINGS) ×1
CLSR STERI-STRIP ANTIMIC 1/2X4 (GAUZE/BANDAGES/DRESSINGS) ×1 IMPLANT
CORDS BIPOLAR (ELECTRODE) ×3 IMPLANT
COVER BACK TABLE 80X110 HD (DRAPES) ×3 IMPLANT
COVER SURGICAL LIGHT HANDLE (MISCELLANEOUS) ×3 IMPLANT
DERMABOND ADVANCED (GAUZE/BANDAGES/DRESSINGS) ×2
DERMABOND ADVANCED .7 DNX12 (GAUZE/BANDAGES/DRESSINGS) ×1 IMPLANT
DRAPE C-ARM 42X72 X-RAY (DRAPES) ×3 IMPLANT
DRAPE MICROSCOPE LEICA (MISCELLANEOUS) ×3 IMPLANT
DRAPE SURG 17X23 STRL (DRAPES) ×9 IMPLANT
DRSG AQUACEL AG ADV 3.5X10 (GAUZE/BANDAGES/DRESSINGS) ×2 IMPLANT
DRSG EMULSION OIL 3X3 NADH (GAUZE/BANDAGES/DRESSINGS) ×3 IMPLANT
DRSG MEPILEX BORDER 4X4 (GAUZE/BANDAGES/DRESSINGS) ×3 IMPLANT
DRSG MEPILEX BORDER 4X8 (GAUZE/BANDAGES/DRESSINGS) ×3 IMPLANT
DRSG PAD ABDOMINAL 8X10 ST (GAUZE/BANDAGES/DRESSINGS) ×6 IMPLANT
DURAPREP 26ML APPLICATOR (WOUND CARE) ×3 IMPLANT
DURASEAL SPINE SEALANT 3ML (MISCELLANEOUS) ×2 IMPLANT
ELECT BLADE 4.0 EZ CLEAN MEGAD (MISCELLANEOUS) ×3
ELECT CAUTERY BLADE 6.4 (BLADE) ×3 IMPLANT
ELECT REM PT RETURN 9FT ADLT (ELECTROSURGICAL) ×3
ELECTRODE BLDE 4.0 EZ CLN MEGD (MISCELLANEOUS) ×1 IMPLANT
ELECTRODE REM PT RTRN 9FT ADLT (ELECTROSURGICAL) ×1 IMPLANT
EVACUATOR 1/8 PVC DRAIN (DRAIN) ×3 IMPLANT
GLOVE BIOGEL PI IND STRL 8 (GLOVE) ×2 IMPLANT
GLOVE BIOGEL PI INDICATOR 8 (GLOVE) ×4
GLOVE ORTHO TXT STRL SZ7.5 (GLOVE) ×6 IMPLANT
GOWN STRL REUS W/ TWL LRG LVL3 (GOWN DISPOSABLE) ×1 IMPLANT
GOWN STRL REUS W/ TWL XL LVL3 (GOWN DISPOSABLE) ×1 IMPLANT
GOWN STRL REUS W/TWL 2XL LVL3 (GOWN DISPOSABLE) ×3 IMPLANT
GOWN STRL REUS W/TWL LRG LVL3 (GOWN DISPOSABLE) ×3
GOWN STRL REUS W/TWL XL LVL3 (GOWN DISPOSABLE) ×3
HEMOSTAT SURGICEL 2X14 (HEMOSTASIS) IMPLANT
KIT BASIN OR (CUSTOM PROCEDURE TRAY) ×3 IMPLANT
KIT POSITION SURG JACKSON T1 (MISCELLANEOUS) IMPLANT
KIT ROOM TURNOVER OR (KITS) ×3 IMPLANT
MANIFOLD NEPTUNE II (INSTRUMENTS) ×3 IMPLANT
MATRIX HEMOSTAT SURGIFLO (HEMOSTASIS) ×6 IMPLANT
NDL SUT .5 MAYO 1.404X.05X (NEEDLE) ×1 IMPLANT
NEEDLE MAYO TAPER (NEEDLE) ×3
NS IRRIG 1000ML POUR BTL (IV SOLUTION) ×3 IMPLANT
PACK LAMINECTOMY ORTHO (CUSTOM PROCEDURE TRAY) ×3 IMPLANT
PAD ARMBOARD 7.5X6 YLW CONV (MISCELLANEOUS) ×6 IMPLANT
PATTIES SURGICAL .5 X.5 (GAUZE/BANDAGES/DRESSINGS) IMPLANT
PATTIES SURGICAL .75X.75 (GAUZE/BANDAGES/DRESSINGS) ×3 IMPLANT
ROD 45MM (Rod) ×3 IMPLANT
ROD 5.5X65MM (Rod) ×2 IMPLANT
ROD SPNL CVD 45X5.5XHRD NS (Rod) IMPLANT
SCREW 7.0X45MM (Screw) ×2 IMPLANT
SCREW MATRIX MIS 6.0X40MM (Screw) ×4 IMPLANT
SCREW MATRIX MIS 7.0X40MM (Screw) ×2 IMPLANT
SPACER CONCORDE PRO 9X7X27MM (Spacer) ×2 IMPLANT
SPONGE LAP 18X18 X RAY DECT (DISPOSABLE) IMPLANT
SPONGE LAP 4X18 X RAY DECT (DISPOSABLE) ×6 IMPLANT
SPONGE SURGIFOAM ABS GEL 100 (HEMOSTASIS) IMPLANT
STAPLER VISISTAT 35W (STAPLE) IMPLANT
SURGIFLO W/THROMBIN 8M KIT (HEMOSTASIS) ×3 IMPLANT
SUT BONE WAX W31G (SUTURE) ×3 IMPLANT
SUT VIC AB 2-0 CT1 27 (SUTURE) ×3
SUT VIC AB 2-0 CT1 TAPERPNT 27 (SUTURE) ×1 IMPLANT
SUT VICRYL 0 TIES 12 18 (SUTURE) ×3 IMPLANT
SUT VICRYL 4-0 PS2 18IN ABS (SUTURE) IMPLANT
SUT VICRYL AB 2 0 TIES (SUTURE) ×3 IMPLANT
TAP CANN 6MM (TAP) ×2 IMPLANT
TOWEL OR 17X24 6PK STRL BLUE (TOWEL DISPOSABLE) ×3 IMPLANT
TOWEL OR 17X26 10 PK STRL BLUE (TOWEL DISPOSABLE) ×3 IMPLANT
TRAY FOLEY W/METER SILVER 16FR (SET/KITS/TRAYS/PACK) ×3 IMPLANT
WATER STERILE IRR 1000ML POUR (IV SOLUTION) ×3 IMPLANT
YANKAUER SUCT BULB TIP NO VENT (SUCTIONS) ×3 IMPLANT

## 2017-05-30 NOTE — H&P (Signed)
Joanna Castro is an 71 y.o. female.   Chief Complaint: back pain and lower extremity radiculopathy  HPI: patient with hx of L2-3 stenosis and above complaint presents for surgical intervention.  Progressively worsening symptoms.  Failed conservative treatment.    Past Medical History:  Diagnosis Date  . Arthritis   . Fibromyalgia   . Glaucoma   . High cholesterol   . History of blood transfusion   . Hypertension   . Kidney cysts    effecting creat.  . Scoliosis   . Sleep apnea    intermittently uses cpap ( started 2 weeks ago)  . Spinal stenosis     Past Surgical History:  Procedure Laterality Date  . ABDOMINAL HYSTERECTOMY    . ANKLE FRACTURE SURGERY    . BACK SURGERY    . CERVICAL SPINE SURGERY  06/2016   [facility]  . CHOLECYSTECTOMY    . EYE SURGERY Bilateral    placed duct tubes  . FINGER SURGERY    . KNEE ARTHROPLASTY Right 04/29/2014   Procedure: COMPUTER ASSISTED TOTAL KNEE ARTHROPLASTY;  Surgeon: Eldred MangesMark C Yates, MD;  Location: [facility] OR;  Service: Orthopedics;  Laterality: Right;  Cemented Right Total Knee Arthroplasty    No family history on file. Social History:  reports that she has never smoked. She has never used smokeless tobacco. She reports that she drinks alcohol. She reports that she does not use drugs.  Allergies:  Allergies  Allergen Reactions  . No Known Allergies     No prescriptions prior to admission.    No results found for this or any previous visit (from the past 48 hour(s)). No results found.  Review of Systems  Constitutional: Negative.   HENT: Negative.   Eyes: Negative.   Respiratory: Negative.   Cardiovascular: Negative.   Gastrointestinal: Negative.   Genitourinary: Negative.   Musculoskeletal: Positive for back pain.  Neurological: Positive for tingling.  Psychiatric/Behavioral: Negative.     There were no vitals taken for this visit. Physical Exam  Constitutional: She is oriented to person, place, and time. No  distress.  HENT:  Head: Normocephalic and atraumatic.  Eyes: Pupils are equal, round, and reactive to light. EOM are normal.  Neck: Normal range of motion.  Respiratory: No respiratory distress.  GI: She exhibits no distension.  Musculoskeletal: She exhibits tenderness.  Patient has some decreased sensation over the anterior aspect the left thigh. No hip flexion or quad weakness. Anterior tib EHL is strong. Distal pulses are palpable. Negative straight leg raising. She ambulates with a cane. Mild pelvic obliquity due to her scoliosis.  Neurological: She is alert and oriented to person, place, and time.  Skin: Skin is warm and dry.  Psychiatric: She has a normal mood and affect.     Assessment/Plan L2=3 stenosis, back pain, LE radiculopathy/neurogenic claudication   Will proceed with Left L2-3 Transforaminal Lumbar Interbody Fusion, Pedicle Instrumentation, Cage as scheduled. Surgical procedure along with possible risks and complications.  All questions answered and wishes to proceed.   Zonia KiefJames Shawnay Bramel, PA-C 05/30/2017, 7:05 AM

## 2017-05-30 NOTE — Transfer of Care (Signed)
Immediate Anesthesia Transfer of Care Note  Patient: Joanna Castro  Procedure(s) Performed: Procedure(s): Left L2-3 Transforaminal Lumbar Interbody Fusion, Pedicle Instrumentation, Cage, repair of dura (N/A)  Patient Location: PACU  Anesthesia Type:General  Level of Consciousness: awake, alert  and oriented  Airway & Oxygen Therapy: Patient Spontanous Breathing and Patient connected to face mask oxygen  Post-op Assessment: Report given to RN and Post -op Vital signs reviewed and stable  Post vital signs: Reviewed and stable  Last Vitals:  Vitals:   05/30/17 1024 05/30/17 1839  BP: (!) 148/74 118/78  Pulse: 80 76  Resp: 20 13  Temp: 36.8 C 36.8 C    Last Pain:  Vitals:   05/30/17 1845  TempSrc:   PainSc: Asleep         Complications: No apparent anesthesia complications

## 2017-05-30 NOTE — Interval H&P Note (Signed)
History and Physical Interval Note:  05/30/2017 1:19 PM  Joanna Castro  has presented today for surgery, with the diagnosis of L2-3 Stenosis, Previous L3-L5 Fusion  The various methods of treatment have been discussed with the patient and family. After consideration of risks, benefits and other options for treatment, the patient has consented to  Procedure(s): Left L2-3 Transforaminal Lumbar Interbody Fusion, Pedicle Instrumentation, Cage (N/A) as a surgical intervention .  The patient's history has been reviewed, patient examined, no change in status, stable for surgery.  I have reviewed the patient's chart and labs.  Questions were answered to the patient's satisfaction.     Eldred MangesMark C Derek Huneycutt

## 2017-05-30 NOTE — Anesthesia Procedure Notes (Signed)
Procedure Name: Intubation Date/Time: 05/30/2017 1:40 PM Performed by: Annabelle HarmanSMITH, Hyatt Capobianco A Pre-anesthesia Checklist: Patient identified, Emergency Drugs available, Suction available and Patient being monitored Patient Re-evaluated:Patient Re-evaluated prior to induction Oxygen Delivery Method: Circle system utilized Preoxygenation: Pre-oxygenation with 100% oxygen Induction Type: IV induction Ventilation: Mask ventilation without difficulty and Oral airway inserted - appropriate to patient size Laryngoscope Size: Glidescope and 3 Grade View: Grade I Tube type: Oral Tube size: 7.5 mm Number of attempts: 1 Airway Equipment and Method: Rigid stylet and Video-laryngoscopy Placement Confirmation: ETT inserted through vocal cords under direct vision,  positive ETCO2 and breath sounds checked- equal and bilateral Secured at: 23 cm Tube secured with: Tape Dental Injury: Teeth and Oropharynx as per pre-operative assessment  Difficulty Due To: Difficulty was anticipated and Difficult Airway- due to reduced neck mobility Comments: After induction, small laceration noted on upper lip.  Pressure applied and ointment applied.
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
## 2017-05-30 NOTE — Anesthesia Postprocedure Evaluation (Signed)
Anesthesia Post Note  Patient: Joanna Castro  Procedure(s) Performed: Procedure(s) (LRB): Left L2-3 Transforaminal Lumbar Interbody Fusion, Pedicle Instrumentation, Cage, repair of dura (N/A)     Patient location during evaluation: PACU Anesthesia Type: General Level of consciousness: awake, awake and alert and oriented Pain management: pain level controlled Respiratory status: spontaneous breathing, nonlabored ventilation and respiratory function stable Cardiovascular status: blood pressure returned to baseline Anesthetic complications: no    Last Vitals:  Vitals:   05/30/17 1940 05/30/17 1955  BP: 132/70 126/60  Pulse: 77 83  Resp: 12 18  Temp:  36.8 C    Last Pain:  Vitals:   05/30/17 1945  TempSrc:   PainSc: 3                  Danah Reinecke COKER

## 2017-05-30 NOTE — Transfer of Care (Deleted)
Immediate Anesthesia Transfer of Care Note  Patient: Joanna Castro  Procedure(s) Performed: Procedure(s): Left L2-3 Transforaminal Lumbar Interbody Fusion, Pedicle Instrumentation, Cage, repair of dura (N/A)  Patient Location: PACU  Anesthesia Type:General  Level of Consciousness: awake, alert  and oriented  Airway & Oxygen Therapy: Patient Spontanous Breathing and Patient connected to face mask oxygen  Post-op Assessment: Report given to RN and Post -op Vital signs reviewed and stable  Post vital signs: Reviewed and stable  Last Vitals:  Vitals:   05/30/17 1024 05/30/17 1839  BP: (!) 148/74 118/78  Pulse: 80 76  Resp: 20 13  Temp: 36.8 C 36.8 C    Last Pain:  Vitals:   05/30/17 1839  TempSrc:   PainSc: 7          Complications: No apparent anesthesia complications

## 2017-05-30 NOTE — Anesthesia Preprocedure Evaluation (Addendum)
Anesthesia Evaluation  Patient identified by MRN, date of birth, ID band Patient awake    Reviewed: Allergy & Precautions, NPO status , Patient's Chart, lab work & pertinent test results  History of Anesthesia Complications Negative for: history of anesthetic complications  Airway Mallampati: III  TM Distance: >3 FB Neck ROM: Full    Dental   Pulmonary sleep apnea and Continuous Positive Airway Pressure Ventilation ,    breath sounds clear to auscultation       Cardiovascular hypertension, Pt. on medications (-) angina(-) Past MI and (-) CHF  Rhythm:Regular     Neuro/Psych Spinal stenosis glaucoma  Neuromuscular disease negative psych ROS   GI/Hepatic negative GI ROS, Neg liver ROS,   Endo/Other  Morbid obesity  Renal/GU Renal InsufficiencyRenal disease (creat 1.36)     Musculoskeletal  (+) Arthritis , Fibromyalgia -  Abdominal   Peds  Hematology negative hematology ROS (+)   Anesthesia Other Findings   Reproductive/Obstetrics                            Anesthesia Physical Anesthesia Plan  ASA: III  Anesthesia Plan: General   Post-op Pain Management:    Induction: Intravenous  PONV Risk Score and Plan: 3 and Ondansetron, Dexamethasone and Propofol  Airway Management Planned: Oral ETT  Additional Equipment: None  Intra-op Plan:   Post-operative Plan: Extubation in OR  Informed Consent: I have reviewed the patients History and Physical, chart, labs and discussed the procedure including the risks, benefits and alternatives for the proposed anesthesia with the patient or authorized representative who has indicated his/her understanding and acceptance.   Dental advisory given  Plan Discussed with: CRNA and Surgeon  Anesthesia Plan Comments:         Anesthesia Quick Evaluation

## 2017-05-30 NOTE — Brief Op Note (Signed)
05/30/2017  6:57 PM  PATIENT:  Joanna Castro  71 y.o. female  PRE-OPERATIVE DIAGNOSIS:  L2-3 Stenosis, Previous L3-L5 Fusion  POST-OPERATIVE DIAGNOSIS:  L2-3 Stenosis, Previous L3-L5 Fusion  PROCEDURE:  Procedure(s): Left L2-3 Transforaminal Lumbar Interbody Fusion, Pedicle Instrumentation, Cage, repair of dura (N/A) removal of pedicle instrumentation L3-5  SURGEON:  Surgeon(s) and Role:    * Eldred MangesYates, Shaneequa Bahner C, MD - Primary  PHYSICIAN ASSISTANT: Zonia KiefJames Owens PA-C  ASSISTANTS:   ANESTHESIA:   general  EBL:  Total I/O In: 3750 [I.V.:2700; Blood:300; IV Piggyback:750] Out: 1180 [Urine:280; Blood:900]  BLOOD ADMINISTERED: see above retransfusion from cell saver 300cc  DRAINS: none   LOCAL MEDICATIONS USED:  NONE  SPECIMEN:  No Specimen  DISPOSITION OF SPECIMEN:  N/A  COUNTS:  YES  TOURNIQUET:  * No tourniquets in log *  DICTATION: .Dragon Dictation  PLAN OF CARE: Admit to inpatient   PATIENT DISPOSITION:  PACU - hemodynamically stable.   Delay start of Pharmacological VTE agent (>24hrs) due to surgical blood loss or risk of bleeding: not applicable

## 2017-05-31 LAB — BASIC METABOLIC PANEL
Anion gap: 7 (ref 5–15)
BUN: 17 mg/dL (ref 6–20)
CO2: 23 mmol/L (ref 22–32)
Calcium: 8.4 mg/dL — ABNORMAL LOW (ref 8.9–10.3)
Chloride: 109 mmol/L (ref 101–111)
Creatinine, Ser: 1.23 mg/dL — ABNORMAL HIGH (ref 0.44–1.00)
GFR calc Af Amer: 50 mL/min — ABNORMAL LOW (ref >60)
GFR, EST NON AFRICAN AMERICAN: 43 mL/min — AB (ref >60)
GLUCOSE: 145 mg/dL — AB (ref 65–99)
POTASSIUM: 4.6 mmol/L (ref 3.5–5.1)
Sodium: 139 mmol/L (ref 135–145)

## 2017-05-31 LAB — CBC
HCT: 29.3 % — ABNORMAL LOW (ref 36.0–46.0)
Hemoglobin: 9.6 g/dL — ABNORMAL LOW (ref 12.0–15.0)
MCH: 27.7 pg (ref 26.0–34.0)
MCHC: 32.8 g/dL (ref 30.0–36.0)
MCV: 84.7 fL (ref 78.0–100.0)
Platelets: 180 10*3/uL (ref 150–400)
RBC: 3.46 MIL/uL — AB (ref 3.87–5.11)
RDW: 14 % (ref 11.5–15.5)
WBC: 10.8 10*3/uL — ABNORMAL HIGH (ref 4.0–10.5)

## 2017-05-31 MED FILL — Sodium Chloride IV Soln 0.9%: INTRAVENOUS | Qty: 2000 | Status: AC

## 2017-05-31 MED FILL — Heparin Sodium (Porcine) Inj 1000 Unit/ML: INTRAMUSCULAR | Qty: 30 | Status: AC

## 2017-05-31 NOTE — Progress Notes (Signed)
Subjective: Pain controlled back.  Does c/o headache this morning but better.  Denies n/v, lightheaded/dizziness.    Objective: Vital signs in last 24 hours: Temp:  [97.4 F (36.3 C)-98.3 F (36.8 C)] 98 F (36.7 C) (07/24 0443) Pulse Rate:  [75-83] 81 (07/24 0443) Resp:  [10-20] 18 (07/24 0443) BP: (114-148)/(58-83) 120/58 (07/24 0443) SpO2:  [95 %-100 %] 96 % (07/24 0443) Weight:  [192 lb (87.1 kg)] 192 lb (87.1 kg) (07/23 1024)  Intake/Output from previous day: 07/23 0701 - 07/24 0700 In: 4324.3 [P.O.:60; I.V.:3164.3; Blood:300; IV Piggyback:800] Out: 1180 [Urine:280; Blood:900] Intake/Output this shift: No intake/output data recorded.   Recent Labs  05/30/17 1049 05/30/17 1918 05/31/17 0537  HGB 12.6 10.2* 9.6*    Recent Labs  05/30/17 1918 05/31/17 0537  WBC  --  10.8*  RBC  --  3.46*  HCT 31.3* 29.3*  PLT  --  180    Recent Labs  05/30/17 1918 05/31/17 0537  NA 141 139  K 4.1 4.6  CL 110 109  CO2 22 23  BUN 10 17  CREATININE 0.93 1.23*  GLUCOSE 189* 145*  CALCIUM 8.5* 8.4*   No results for input(s): LABPT, INR in the last 72 hours.  Exam: Alert and oriented.  NVI.  No focal motor deficits.   Assessment/Plan: Will keep patient flat due to dural tear/repair.  Possible up tomorrow.     Zonia KiefJames Milinda Sweeney 05/31/2017, 7:57 AM

## 2017-05-31 NOTE — Progress Notes (Addendum)
PT Cancellation Note  Patient Details Name: Joanna Castro MRN: [id number] DOB: [birth year]   Cancelled Treatment:    Reason Eval/Treat Not Completed: PT order cancelled due to PA note stating, "Will keep patient flat due to dural tear/repair. Possible up tomorrow."  Please re-consult PT when appropriate.   Canden Cieslinski M Reilley Valentine 05/31/2017, 9:49 AM   Lewis ShockAshly Lezette Kitts, PT, DPT Pager #: [phone] Office #: [phone]

## 2017-05-31 NOTE — Progress Notes (Signed)
OT Cancellation Note  Patient Details Name: Joanna Castro MRN: [id number] DOB: Oct 29, 1946   Cancelled Treatment:     OT orders discontinued on 05/31/2017, per PA note "Will keep patient flat due to dural tear/repair.  Possible up tomorrow." Please re order OT when appropriate  [name], [name] [name] 05/31/2017, 9:17 AM

## 2017-05-31 NOTE — Anesthesia Postprocedure Evaluation (Signed)
Anesthesia Post Note  Patient: Joanna Castro  Procedure(s) Performed: Procedure(s) (LRB): Left L2-3 Transforaminal Lumbar Interbody Fusion, Pedicle Instrumentation, Cage, repair of dura (N/A)     Patient location during evaluation: PACU Anesthesia Type: General Level of consciousness: awake, awake and alert and oriented Pain management: pain level controlled Vital Signs Assessment: post-procedure vital signs reviewed and stable Respiratory status: spontaneous breathing, nonlabored ventilation and respiratory function stable Cardiovascular status: blood pressure returned to baseline Anesthetic complications: no    Last Vitals:  Vitals:   05/30/17 2047 05/31/17 0011  BP: 114/62 121/61  Pulse: 79 82  Resp: 18 18  Temp: (!) 36.3 C 36.4 C    Last Pain:  Vitals:   05/31/17 0142  TempSrc:   PainSc: 0-No pain                 Samah Lapiana COKER

## 2017-06-01 LAB — CBC
HEMATOCRIT: 26.6 % — AB (ref 36.0–46.0)
HEMOGLOBIN: 8.7 g/dL — AB (ref 12.0–15.0)
MCH: 27.4 pg (ref 26.0–34.0)
MCHC: 32.7 g/dL (ref 30.0–36.0)
MCV: 83.6 fL (ref 78.0–100.0)
Platelets: 165 10*3/uL (ref 150–400)
RBC: 3.18 MIL/uL — ABNORMAL LOW (ref 3.87–5.11)
RDW: 13.8 % (ref 11.5–15.5)
WBC: 9.9 10*3/uL (ref 4.0–10.5)

## 2017-06-01 NOTE — Plan of Care (Signed)
Problem: Education: Goal: Knowledge of [facility] Education information/materials will improve Outcome: Progressing POC reviewed with pt. on 11-7 shift.

## 2017-06-01 NOTE — Progress Notes (Addendum)
   Subjective: 2 Days Post-Op Procedure(s) (LRB): Left L2-3 Transforaminal Lumbar Interbody Fusion, Pedicle Instrumentation, Cage, repair of dura (N/A) Patient reports pain as moderate.    Objective: Vital signs in last 24 hours: Temp:  [98.2 F (36.8 C)-98.9 F (37.2 C)] 98.3 F (36.8 C) (07/25 0420) Pulse Rate:  [80-92] 91 (07/25 0420) Resp:  [18] 18 (07/25 0420) BP: (113-122)/(45-65) 114/45 (07/25 0420) SpO2:  [93 %-98 %] 93 % (07/25 0420)  Intake/Output from previous day: 07/24 0701 - 07/25 0700 In: 1670 [P.O.:360; I.V.:1310] Out: 1150 [Urine:1150] Intake/Output this shift: No intake/output data recorded.   Recent Labs  05/30/17 1049 05/30/17 1918 05/31/17 0537 06/01/17 0534  HGB 12.6 10.2* 9.6* 8.7*    Recent Labs  05/31/17 0537 06/01/17 0534  WBC 10.8* 9.9  RBC 3.46* 3.18*  HCT 29.3* 26.6*  PLT 180 165    Recent Labs  05/30/17 1918 05/31/17 0537  NA 141 139  K 4.1 4.6  CL 110 109  CO2 22 23  BUN 10 17  CREATININE 0.93 1.23*  GLUCOSE 189* 145*  CALCIUM 8.5* 8.4*   No results for input(s): LABPT, INR in the last 72 hours.  Neurologically intact No results found.  Assessment/Plan: 2 Days Post-Op Procedure(s) (LRB): Left L2-3 Transforaminal Lumbar Interbody Fusion, Pedicle Instrumentation, Cage, repair of dura (N/A) Up with therapy , plan d/c bedrest. Begin therapy.   Dc foley when able to get to bedside commode.   Eldred MangesMark C Liberti Appleton 06/01/2017, 7:22 AM

## 2017-06-01 NOTE — Evaluation (Signed)
Physical Therapy Evaluation Patient Details Name: Joanna Castro MRN: [id number] DOB: 09-Aug-1946 Today's Date: 06/01/2017   History of Present Illness  Pt is 71 y/o female s/p  L2-3 TLIF, removal of hardware at L3-5 and repair of dura. PMH includes HTN, sleep apnea, fibromyalgia, s/p TKA, s/p back surgery.   Clinical Impression  Pt s/p surgery above with deficits below. PTA, pt reports she was using a cane for ambulation. Also reports she slept on her R foot wrong and had been having difficulty walking. Upon eval, pt limited by post op pain, as well as, decreased balance, strength, and pain in R foot. Pt only able to take 3-4 side steps along EOB secondary to R foot pain and then requested to sit down. Required min to mod A for mobility this session. Pt wanting to go home at d/c; stated she had been to SNF before and did not like it. Will need HHPT and assist 24 hours given current limitations. Recommending OT consult to address safety with ADLs. Will continue to follow acutely to maximize functional mobility independence.     Follow Up Recommendations Home health PT;Supervision/Assistance - 24 hour    Equipment Recommendations  None recommended by PT (has all DME )    Recommendations for Other Services OT consult     Precautions / Restrictions Precautions Precautions: Back Precaution Booklet Issued: Yes (comment) Precaution Comments: Reviewed back precautions with pt.  Required Braces or Orthoses: Spinal Brace Spinal Brace: Lumbar corset;Applied in sitting position Restrictions Weight Bearing Restrictions: No      Mobility  Bed Mobility Overal bed mobility: Needs Assistance Bed Mobility: Rolling;Sidelying to Sit;Sit to Sidelying Rolling: Min assist Sidelying to sit: Mod assist     Sit to sidelying: Mod assist General bed mobility comments: Min A to roll completely on side and mod A for trunk elevation. Heavy use of bed rails. Verbal cues to maintain log roll technique. Mod A  for LE lift back to sidelying.   Transfers Overall transfer level: Needs assistance Equipment used: Rolling walker (2 wheeled) Transfers: Sit to/from Stand Sit to Stand: Mod assist;From elevated surface         General transfer comment: Mod A for lift assist. Required use of elevated surface. Verbal cues for safe hand placement.   Ambulation/Gait Ambulation/Gait assistance: Min assist Ambulation Distance (Feet): 1 Feet Assistive device: Rolling walker (2 wheeled)       General Gait Details: Able to take 3-4 side steps along side of bed, however, reports increased R foot pain which limited further mobility.   Stairs            Wheelchair Mobility    Modified Rankin (Stroke Patients Only)       Balance Overall balance assessment: Needs assistance Sitting-balance support: No upper extremity supported;Feet supported Sitting balance-Leahy Scale: Good     Standing balance support: Bilateral upper extremity supported;During functional activity Standing balance-Leahy Scale: Poor Standing balance comment: Reliant on RW for stability                              Pertinent Vitals/Pain Pain Assessment: Faces Faces Pain Scale: Hurts even more Pain Location: back and R foot  Pain Descriptors / Indicators: Aching;Sharp;Sore;Operative site guarding Pain Intervention(s): Monitored during session;Limited activity within patient's tolerance;Repositioned    Home Living Family/patient expects to be discharged to:: Private residence Living Arrangements: Alone Available Help at Discharge: Family;Available 24 hours/day (daughter will be living with  her ) Type of Home: House Home Access: Ramped entrance     Home Layout: One level Home Equipment: Walker - 2 wheels;Cane - single point;Shower seat;Bedside commode;Grab bars - tub/shower;Wheelchair - manual      Prior Function Level of Independence: Independent with assistive device(s)         Comments: Used cane  for ambulation     Hand Dominance        Extremity/Trunk Assessment   Upper Extremity Assessment Upper Extremity Assessment: Defer to OT evaluation    Lower Extremity Assessment Lower Extremity Assessment: RLE deficits/detail RLE Deficits / Details: R foot pain; reports she slept on it wrong and has been very painful ever since.     Cervical / Trunk Assessment Cervical / Trunk Assessment: Other exceptions Cervical / Trunk Exceptions: s/p lumbar surgery.   Communication   Communication: No difficulties  Cognition Arousal/Alertness: Awake/alert Behavior During Therapy: WFL for tasks assessed/performed Overall Cognitive Status: Within Functional Limits for tasks assessed                                        General Comments General comments (skin integrity, edema, etc.): Pt currently wanting to go home at d/c, despite limitations. Very motivated to get better. May need new brace as current brace is very snug.     Exercises     Assessment/Plan    PT Assessment Patient needs continued PT services  PT Problem List Decreased strength;Decreased activity tolerance;Decreased balance;Decreased range of motion;Decreased mobility;Decreased knowledge of use of DME;Decreased knowledge of precautions;Pain       PT Treatment Interventions DME instruction;Gait training;Functional mobility training;Therapeutic activities;Neuromuscular re-education;Patient/family education    PT Goals (Current goals can be found in the Care Plan section)  Acute Rehab PT Goals Patient Stated Goal: to go home  PT Goal Formulation: With patient Time For Goal Achievement: 06/15/17 Potential to Achieve Goals: Good    Frequency Min 5X/week   Barriers to discharge        Co-evaluation               AM-PAC PT "6 Clicks" Daily Activity  Outcome Measure Difficulty turning over in bed (including adjusting bedclothes, sheets and blankets)?: Total Difficulty moving from lying on  back to sitting on the side of the bed? : Total Difficulty sitting down on and standing up from a chair with arms (e.g., wheelchair, bedside commode, etc,.)?: Total Help needed moving to and from a bed to chair (including a wheelchair)?: A Lot Help needed walking in hospital room?: A Lot Help needed climbing 3-5 steps with a railing? : Total 6 Click Score: 8    End of Session Equipment Utilized During Treatment: Gait belt;Back brace Activity Tolerance: Patient limited by pain Patient left: in bed;with call bell/phone within reach Nurse Communication: Mobility status;Other (comment) (IV beeping ) PT Visit Diagnosis: Other abnormalities of gait and mobility (R26.89);Pain Pain - Right/Left: Right Pain - part of body: Ankle and joints of foot (back )    Time: [id number] PT Time Calculation (min) (ACUTE ONLY): 28 min   Charges:   PT Evaluation $PT Eval Low Complexity: 1 Procedure PT Treatments $Therapeutic Activity: 8-22 mins   PT G Codes:        Gladys DammeBrittany Aliena Ghrist, PT, DPT  Acute Rehabilitation Services  Pager: [phone]   Lehman PromBrittany S Annalee Meyerhoff 06/01/2017, 6:04 PM

## 2017-06-02 ENCOUNTER — Encounter (HOSPITAL_COMMUNITY): Payer: Self-pay | Admitting: *Deleted

## 2017-06-02 LAB — CBC
HCT: 26.4 % — ABNORMAL LOW (ref 36.0–46.0)
Hemoglobin: 8.8 g/dL — ABNORMAL LOW (ref 12.0–15.0)
MCH: 27.4 pg (ref 26.0–34.0)
MCHC: 33.3 g/dL (ref 30.0–36.0)
MCV: 82.2 fL (ref 78.0–100.0)
Platelets: 174 10*3/uL (ref 150–400)
RBC: 3.21 MIL/uL — AB (ref 3.87–5.11)
RDW: 13.5 % (ref 11.5–15.5)
WBC: 10 10*3/uL (ref 4.0–10.5)

## 2017-06-02 NOTE — Progress Notes (Signed)
Physical Therapy Treatment Patient Details Name: Joanna Castro MRN: [id number] DOB: Apr 21, 1946 Today's Date: 06/02/2017    History of Present Illness Pt is 71 y/o female s/p  L2-3 TLIF, removal of hardware at L3-5 and repair of dura. PMH includes HTN, sleep apnea, fibromyalgia, s/p TKA, s/p back surgery.     PT Comments    Patient is making gradual progress toward mobility goals. Pt tolerated short gait distance this session. Min/mod A for all mobility. Pt will benefit from new back brace as her current brace is too small. Continue to progress as tolerated.    Follow Up Recommendations  Home health PT;Supervision/Assistance - 24 hour     Equipment Recommendations  None recommended by PT (has all DME )    Recommendations for Other Services OT consult     Precautions / Restrictions Precautions Precautions: Back Precaution Comments: pt able to state 3/3 back precautions Required Braces or Orthoses: Spinal Brace Spinal Brace: Lumbar corset;Applied in sitting position Restrictions Weight Bearing Restrictions: No    Mobility  Bed Mobility Overal bed mobility: Needs Assistance Bed Mobility: Rolling;Sit to Sidelying Rolling: Min assist Sidelying to sit: Min assist     Sit to sidelying: Mod assist General bed mobility comments: cues for sequencing and technique; assist at bilat LE and trunk to maintain back precautions  Transfers Overall transfer level: Needs assistance Equipment used: Rolling walker (2 wheeled) Transfers: Sit to/from Stand Sit to Stand: Min assist;+2 safety/equipment         General transfer comment: assist to power up into standing and cues for technique to decrease bending  Ambulation/Gait Ambulation/Gait assistance: Min assist Ambulation Distance (Feet): 14 Feet Assistive device: Rolling walker (2 wheeled) Gait Pattern/deviations: Step-through pattern;Decreased stance time - right;Decreased step length - left;Decreased dorsiflexion - left;Trunk  flexed;Antalgic Gait velocity: decreased   General Gait Details: cues for posture   Stairs            Wheelchair Mobility    Modified Rankin (Stroke Patients Only)       Balance Overall balance assessment: Needs assistance Sitting-balance support: No upper extremity supported;Feet supported Sitting balance-Leahy Scale: Good     Standing balance support: Bilateral upper extremity supported;During functional activity Standing balance-Leahy Scale: Poor Standing balance comment: Reliant on RW for stability                             Cognition Arousal/Alertness: Awake/alert Behavior During Therapy: WFL for tasks assessed/performed Overall Cognitive Status: Within Functional Limits for tasks assessed                                        Exercises      General Comments General comments (skin integrity, edema, etc.): Current back brace is too snug and does not fit well. Would benefit greatly from a new back brace.       Pertinent Vitals/Pain Pain Assessment: Faces Pain Score: 8  Faces Pain Scale: Hurts little more Pain Location: back and R foot  Pain Descriptors / Indicators: Sore;Guarding Pain Intervention(s): Limited activity within patient's tolerance;Monitored during session;Premedicated before session;Repositioned    Home Living Family/patient expects to be discharged to:: Private residence Living Arrangements: Alone Available Help at Discharge: Family;Available 24 hours/day (daughter will be living with her ) Type of Home: House Home Access: Ramped entrance   Home Layout: One level Home Equipment:  Walker - 2 wheels;Cane - single point;Shower seat;Bedside commode;Grab bars - tub/shower;Wheelchair - manual Additional Comments: cannot fit rw and shower seat in shower at same time. Discussed strategies.    Prior Function Level of Independence: Independent with assistive device(s)      Comments: Used cane for ambulation   PT  Goals (current goals can now be found in the care plan section) Acute Rehab PT Goals Patient Stated Goal: to go home  PT Goal Formulation: With patient Time For Goal Achievement: 06/15/17 Potential to Achieve Goals: Good Progress towards PT goals: Progressing toward goals    Frequency    Min 5X/week      PT Plan Current plan remains appropriate    Co-evaluation              AM-PAC PT "6 Clicks" Daily Activity  Outcome Measure  Difficulty turning over in bed (including adjusting bedclothes, sheets and blankets)?: Total Difficulty moving from lying on back to sitting on the side of the bed? : Total Difficulty sitting down on and standing up from a chair with arms (e.g., wheelchair, bedside commode, etc,.)?: Total Help needed moving to and from a bed to chair (including a wheelchair)?: A Little Help needed walking in hospital room?: A Little Help needed climbing 3-5 steps with a railing? : A Lot 6 Click Score: 11    End of Session Equipment Utilized During Treatment: Gait belt;Back brace Activity Tolerance: Patient tolerated treatment well Patient left: in bed;with call bell/phone within reach;with family/visitor present Nurse Communication: Mobility status PT Visit Diagnosis: Other abnormalities of gait and mobility (R26.89);Pain Pain - Right/Left: Right Pain - part of body: Ankle and joints of foot (back )     Time: 1610-96040946-1004 PT Time Calculation (min) (ACUTE ONLY): 18 min  Charges:  $Gait Training: 8-22 mins                    G Codes:       Joanna Castro, PTA Pager: [phone]     Joanna Castro 06/02/2017, 11:09 AM

## 2017-06-02 NOTE — Progress Notes (Signed)
   Subjective: 3 Days Post-Op Procedure(s) (LRB): Left L2-3 Transforaminal Lumbar Interbody Fusion, Pedicle Instrumentation, Cage, repair of dura (N/A) Patient reports pain as moderate and severe.  Right foot pain.   Objective: Vital signs in last 24 hours: Temp:  [98.5 F (36.9 C)-99.2 F (37.3 C)] 98.6 F (37 C) (07/26 16100608) Pulse Rate:  [87-94] 87 (07/26 0608) Resp:  [18] 18 (07/26 96040608) BP: (112-123)/(56-61) 112/59 (07/26 54090608) SpO2:  [94 %-95 %] 95 % (07/26 81190608)  Intake/Output from previous day: 07/25 0701 - 07/26 0700 In: 603 [P.O.:600; I.V.:3] Out: 2000 [Urine:2000] Intake/Output this shift: No intake/output data recorded.   Recent Labs  05/30/17 1049 05/30/17 1918 05/31/17 0537 06/01/17 0534 06/02/17 0341  HGB 12.6 10.2* 9.6* 8.7* 8.8*    Recent Labs  06/01/17 0534 06/02/17 0341  WBC 9.9 10.0  RBC 3.18* 3.21*  HCT 26.6* 26.4*  PLT 165 174    Recent Labs  05/30/17 1918 05/31/17 0537  NA 141 139  K 4.1 4.6  CL 110 109  CO2 22 23  BUN 10 17  CREATININE 0.93 1.23*  GLUCOSE 189* 145*  CALCIUM 8.5* 8.4*   No results for input(s): LABPT, INR in the last 72 hours.  Neurologically intact  Right AT GC quads 5/5.  Has tenderness over right plantar foot.  No results found.  Assessment/Plan: 3 Days Post-Op Procedure(s) (LRB): Left L2-3 Transforaminal Lumbar Interbody Fusion, Pedicle Instrumentation, Cage, repair of dura (N/A) Up with therapy. She does not want to go to SNF. Continue therapy.   Eldred MangesMark C Lovell Roe 06/02/2017, 8:03 AM

## 2017-06-02 NOTE — Evaluation (Signed)
Occupational Therapy Evaluation Patient Details Name: Joanna Castro MRN: [id number] DOB: Apr 26, 1946 Today's Date: 06/02/2017    History of Present Illness Pt is 71 y/o female s/p  L2-3 TLIF, removal of hardware at L3-5 and repair of dura. PMH includes HTN, sleep apnea, fibromyalgia, s/p TKA, s/p back surgery.    Clinical Impression   Pt admitted with the above diagnoses and presents with below problem list. Pt will benefit from continued acute OT to address the below listed deficits and maximize independence with basic ADLs prior to d/c home with daughter assisting. PTA pt was mod I with ADLs. Pt is currently min to mod A with most ADLs and functional transfers. SPT from EOB to recliner with pt requesting to sit down immediately due to feeling "flushy." Daughter present and involved in session. Of note, current back brace from home appears ill-fitting, too small. Would likely benefit from new back brace.       Follow Up Recommendations  Home health OT;Supervision/Assistance - 24 hour    Equipment Recommendations  None recommended by OT    Recommendations for Other Services       Precautions / Restrictions Precautions Precautions: Back Precaution Comments: Reviewed back precautions with pt.  Required Braces or Orthoses: Spinal Brace Spinal Brace: Lumbar corset;Applied in sitting position Restrictions Weight Bearing Restrictions: No      Mobility Bed Mobility Overal bed mobility: Needs Assistance Bed Mobility: Rolling;Sidelying to Sit Rolling: Min assist Sidelying to sit: Min assist       General bed mobility comments: HOB partially elevated. Cues for technique. Min A to steady during power up sidelying to EOB. Able to scoot forward with extra time and effort, cues for technique to avoid twisting.   Transfers Overall transfer level: Needs assistance Equipment used: Rolling walker (2 wheeled) Transfers: Sit to/from Stand Sit to Stand: Mod assist;From elevated surface          General transfer comment: Mod A to powerup. Initially attempted with B hands on mattress to promote good body mechanics coming to a stand. Ultimately completed with pt's with left hand on rw and right hand on mattress.     Balance Overall balance assessment: Needs assistance Sitting-balance support: No upper extremity supported;Feet supported Sitting balance-Leahy Scale: Good     Standing balance support: Bilateral upper extremity supported;During functional activity Standing balance-Leahy Scale: Poor Standing balance comment: Reliant on RW for stability                            ADL either performed or assessed with clinical judgement   ADL Overall ADL's : Needs assistance/impaired Eating/Feeding: Set up;Sitting   Grooming: Set up;Sitting   Upper Body Bathing: Minimal assistance;Sitting   Lower Body Bathing: Moderate assistance;Sit to/from stand   Upper Body Dressing : Minimal assistance;Sitting   Lower Body Dressing: Moderate assistance;Sit to/from stand   Toilet Transfer: Minimal assistance;Moderate assistance;Stand-pivot;BSC;RW   Toileting- Clothing Manipulation and Hygiene: Moderate assistance;Sit to/from stand Toileting - Clothing Manipulation Details (indicate cue type and reason): discussed AE for pericare to avoid twisting Tub/ Shower Transfer: Walk-in shower;Minimal assistance;Moderate assistance;Stand-pivot;Shower seat;Rolling walker     General ADL Comments: Pt completed bed mobility and SPT to recliner as detailed above. ADL education provided to pt and daughter.      Vision         Perception     Praxis      Pertinent Vitals/Pain Pain Assessment: 0-10 Pain Score: 8  Pain  Location: back at end of session sitting up in chair. Pain Descriptors / Indicators: Aching;Sharp;Sore;Operative site guarding Pain Intervention(s): Limited activity within patient's tolerance;Monitored during session;Repositioned;Patient requesting pain meds-RN  notified (pillow behind back and under seat)     Hand Dominance     Extremity/Trunk Assessment Upper Extremity Assessment Upper Extremity Assessment: Overall WFL for tasks assessed   Lower Extremity Assessment Lower Extremity Assessment: Defer to PT evaluation   Cervical / Trunk Assessment Cervical / Trunk Assessment: Other exceptions Cervical / Trunk Exceptions: s/p lumbar surgery.    Communication Communication Communication: No difficulties   Cognition Arousal/Alertness: Awake/alert Behavior During Therapy: WFL for tasks assessed/performed Overall Cognitive Status: Within Functional Limits for tasks assessed                                     General Comments  Current back brace is too snug and does not fit well. Would benefit greatly from a new back brace.     Exercises     Shoulder Instructions      Home Living Family/patient expects to be discharged to:: Private residence Living Arrangements: Alone Available Help at Discharge: Family;Available 24 hours/day (daughter will be living with her ) Type of Home: House Home Access: Ramped entrance     Home Layout: One level     Bathroom Shower/Tub: Walk-in shower         Home Equipment: Environmental consultant - 2 wheels;Cane - single point;Shower seat;Bedside commode;Grab bars - tub/shower;Wheelchair - manual   Additional Comments: cannot fit rw and shower seat in shower at same time. Discussed strategies.      Prior Functioning/Environment Level of Independence: Independent with assistive device(s)        Comments: Used cane for ambulation        OT Problem List: Impaired balance (sitting and/or standing);Decreased activity tolerance;Decreased knowledge of use of DME or AE;Decreased knowledge of precautions;Pain      OT Treatment/Interventions: Self-care/ADL training;DME and/or AE instruction;Therapeutic activities;Patient/family education;Balance training    OT Goals(Current goals can be found in  the care plan section) Acute Rehab OT Goals Patient Stated Goal: to go home  OT Goal Formulation: With patient/family Time For Goal Achievement: 06/09/17 Potential to Achieve Goals: Good ADL Goals Pt Will Perform Lower Body Bathing: with min guard assist;with adaptive equipment;sit to/from stand Pt Will Perform Upper Body Dressing: with set-up;sitting;with modified independence Pt Will Perform Lower Body Dressing: with min guard assist;with adaptive equipment;sit to/from stand Pt Will Transfer to Toilet: with min guard assist;ambulating;bedside commode Pt Will Perform Toileting - Clothing Manipulation and hygiene: with min guard assist;sit to/from stand;sitting/lateral leans Pt Will Perform Tub/Shower Transfer: with min guard assist;ambulating;shower seat;rolling walker Additional ADL Goal #1: Pt will be min guard with bed mobility to prepare for OOB ADLs.   OT Frequency: Min 2X/week   Barriers to D/C:            Co-evaluation              AM-PAC PT "6 Clicks" Daily Activity     Outcome Measure Help from another person eating meals?: None Help from another person taking care of personal grooming?: None Help from another person toileting, which includes using toliet, bedpan, or urinal?: A Lot Help from another person bathing (including washing, rinsing, drying)?: A Lot Help from another person to put on and taking off regular upper body clothing?: A Little Help from another person to  put on and taking off regular lower body clothing?: A Lot 6 Click Score: 17   End of Session Equipment Utilized During Treatment: Back brace;Rolling walker Nurse Communication: Patient requests pain meds;Mobility status  Activity Tolerance: Patient limited by pain;Other (comment) (feeling "flushy" during SPT, requesting to sit down.) Patient left: in chair;with call bell/phone within reach;with family/visitor present  OT Visit Diagnosis: Other abnormalities of gait and mobility (R26.89);Pain                 Time: [id number] OT Time Calculation (min): 30 min Charges:  OT General Charges $OT Visit: 1 Procedure OT Evaluation $OT Eval Low Complexity: 1 Procedure OT Treatments $Self Care/Home Management : 8-22 mins G-Codes:       Pilar GrammesMathews, Kaoru Rezendes H 06/02/2017, 10:10 AM

## 2017-06-03 DIAGNOSIS — R338 Other retention of urine: Secondary | ICD-10-CM

## 2017-06-03 DIAGNOSIS — N9989 Other postprocedural complications and disorders of genitourinary system: Secondary | ICD-10-CM

## 2017-06-03 MED ORDER — OXYCODONE-ACETAMINOPHEN 5-325 MG PO TABS: 1.0000 | ORAL_TABLET | ORAL | 0 refills | Status: DC | PRN

## 2017-06-03 MED ORDER — BISACODYL 10 MG RE SUPP
10.0000 mg | Freq: Once | RECTAL | Status: AC
  Administered 2017-06-03: 10 mg via RECTAL
  Filled 2017-06-03: qty 1

## 2017-06-03 MED ORDER — FLEET ENEMA 7-19 GM/118ML RE ENEM: 1.0000 | ENEMA | Freq: Every day | RECTAL | Status: DC | PRN

## 2017-06-03 NOTE — Progress Notes (Signed)
Bladder scan 130cc. Voided 200 right after. Ambulating in halls. New brace applied. Once bowels good D/C home today. Office two weeks.

## 2017-06-03 NOTE — Progress Notes (Signed)
Physical Therapy Treatment Patient Details Name: Joanna Castro Nurse MRN: [id number] DOB: 20-Jan-1946 Today's Date: 06/03/2017    History of Present Illness Pt is 71 y/o female s/p  L2-3 TLIF, removal of hardware at L3-5 and repair of dura. PMH includes HTN, sleep apnea, fibromyalgia, s/p TKA, s/p back surgery.     PT Comments    Patient continues to progress toward mobility goals. Pt required less assist for bed mobility and transfers and tolerated increased gait distance. Pt will continue to benefit from further skilled PT services to maximize independence and safety with mobility.  Current plan remains appropriate.   Follow Up Recommendations  Home health PT;Supervision/Assistance - 24 hour     Equipment Recommendations  None recommended by PT (has all DME )    Recommendations for Other Services OT consult     Precautions / Restrictions Precautions Precautions: Back Precaution Comments: pt able to state 3/3 back precautions Required Braces or Orthoses: Spinal Brace Spinal Brace: Lumbar corset;Applied in sitting position Restrictions Weight Bearing Restrictions: No    Mobility  Bed Mobility Overal bed mobility: Needs Assistance Bed Mobility: Rolling;Sidelying to Sit Rolling: Min assist Sidelying to sit: Min guard       General bed mobility comments: cues for sequencing and technique  Transfers Overall transfer level: Needs assistance Equipment used: Rolling walker (2 wheeled) Transfers: Sit to/from Stand Sit to Stand: Min guard         General transfer comment: min guard for safety; cues for hand placement and technique  Ambulation/Gait Ambulation/Gait assistance: Min guard Ambulation Distance (Feet):  (150ft X2) Assistive device: Rolling walker (2 wheeled) Gait Pattern/deviations: Step-through pattern;Trunk flexed;Decreased stride length Gait velocity: decreased   General Gait Details: cues for posture   Stairs            Wheelchair Mobility     Modified Rankin (Stroke Patients Only)       Balance Overall balance assessment: Needs assistance Sitting-balance support: No upper extremity supported;Feet supported Sitting balance-Leahy Scale: Good     Standing balance support: Bilateral upper extremity supported;During functional activity Standing balance-Leahy Scale: Poor Standing balance comment: Reliant on RW for stability                             Cognition Arousal/Alertness: Awake/alert Behavior During Therapy: WFL for tasks assessed/performed Overall Cognitive Status: Within Functional Limits for tasks assessed                                        Exercises      General Comments        Pertinent Vitals/Pain Pain Assessment: Faces Faces Pain Scale: Hurts little more Pain Location: back   Pain Descriptors / Indicators: Sore;Guarding Pain Intervention(s): Monitored during session;Premedicated before session;Repositioned    Home Living                      Prior Function            PT Goals (current goals can now be found in the care plan section) Acute Rehab PT Goals Patient Stated Goal: to go home  PT Goal Formulation: With patient Time For Goal Achievement: 06/15/17 Potential to Achieve Goals: Good Progress towards PT goals: Progressing toward goals    Frequency    Min 5X/week      PT Plan Current  plan remains appropriate    Co-evaluation              AM-PAC PT "6 Clicks" Daily Activity  Outcome Measure  Difficulty turning over in bed (including adjusting bedclothes, sheets and blankets)?: Total Difficulty moving from lying on back to sitting on the side of the bed? : A Lot Difficulty sitting down on and standing up from a chair with arms (e.g., wheelchair, bedside commode, etc,.)?: Total Help needed moving to and from a bed to chair (including a wheelchair)?: A Little Help needed walking in hospital room?: A Little Help needed climbing  3-5 steps with a railing? : A Lot 6 Click Score: 12    End of Session Equipment Utilized During Treatment: Gait belt;Back brace Activity Tolerance: Patient tolerated treatment well Patient left: with call bell/phone within reach;with family/visitor present;in chair Nurse Communication: Mobility status PT Visit Diagnosis: Other abnormalities of gait and mobility (R26.89);Pain Pain - Right/Left: Right Pain - part of body: Ankle and joints of foot (back )     Time: [id number] PT Time Calculation (min) (ACUTE ONLY): 21 min  Charges:  $Gait Training: 8-22 mins                    G Codes:       Joanna Castro, PTA Pager: [phone]     Joanna Castro 06/03/2017, 1:12 PM

## 2017-06-03 NOTE — Progress Notes (Signed)
Orthopedic Tech Progress Note Patient Details:  Joanna Castro 1945/11/11 [id number]  Ortho Devices Ortho Device/Splint Location: Dr. Ophelia CharterYates requested another Sacral Lumber support because previous support was too small.  Requested from [employer] tech Ileene Musa([facility] Orth tech). Ortho Device/Splint Interventions: Allyne GeeOrdered   Rannie Craney C 06/03/2017, 10:17 AM

## 2017-06-03 NOTE — Care Management Note (Signed)
Case Management Note  Patient Details  Name: Joanna Castro MRN: [id number] Date of Birth: 12-02-1945  Subjective/Objective:     Left L2-L3 Transforaminal Lumbar Interbody               Action/Plan: Discharge Planning: NCM spoke to pt and daughter at bedside. Pt states she has RW and bedside commode at home. Dtr will be there to assist as needed. Pt states with her previous back surgeries she did not have HHPT. States she has instructions for PT. No orders in for HHPT.   PCP  Charlette CaffeyPARUCHURI, LAKSHMI P MD  Expected Discharge Date:  06/03/17               Expected Discharge Plan:  Home/Self Care  In-House Referral:  NA  Discharge planning Services  CM Consult  Post Acute Care Choice:  NA Choice offered to:  NA  DME Arranged:  N/A DME Agency:  NA  HH Arranged:  NA HH Agency:  NA  Status of Service:  Completed, signed off  If discussed at Long Length of Stay Meetings, dates discussed:    Additional Comments:  Elliot CousinShavis, [name], RN 06/03/2017, 3:36 PM

## 2017-06-03 NOTE — Progress Notes (Signed)
Discharge instructions (including medications) discussed with and copy provided to patient/caregiver 

## 2017-06-03 NOTE — Progress Notes (Signed)
Occupational Therapy Treatment Patient Details Name: Joanna Castro T Bloch MRN: [id number] DOB: February 09, 1946 Today's Date: 06/03/2017    History of present illness Pt is 71 y/o female s/p  L2-3 TLIF, removal of hardware at L3-5 and repair of dura. PMH includes HTN, sleep apnea, fibromyalgia, s/p TKA, s/p back surgery.    OT comments  Pt progressing towards goals, continues to require MinGuard-MinA for stand pivot transfers, MaxA for LB ADLs due to adhering to back precautions. Further mobility deferred this session as Pt's new lumbar brace slightly too large (paged orthotech for adjustment). Reviewed education regarding AE and compensatory techniques for completing ADLs while adhering to back precautions. Pt's daughter present during session and planning to assist with ADLs PRN after return home. Min verbal cues during session for adhering to back precautions. Will continue to follow acutely.    Follow Up Recommendations  Home health OT;Supervision/Assistance - 24 hour    Equipment Recommendations  None recommended by OT          Precautions / Restrictions Precautions Precautions: Back Precaution Comments: pt able to state 3/3 back precautions Required Braces or Orthoses: Spinal Brace Spinal Brace: Lumbar corset;Applied in sitting position Restrictions Weight Bearing Restrictions: No       Mobility Bed Mobility Overal bed mobility: Needs Assistance Bed Mobility: Rolling;Sidelying to Sit Rolling: Min assist Sidelying to sit: Min guard     Sit to sidelying: Min guard General bed mobility comments: cues for sequencing and technique  Transfers Overall transfer level: Needs assistance Equipment used: Rolling walker (2 wheeled) Transfers: Sit to/from UGI CorporationStand;Stand Pivot Transfers Sit to Stand: Min assist Stand pivot transfers: Min assist       General transfer comment: minA for safety; cues for hand placement and technique    Balance Overall balance assessment: Needs  assistance Sitting-balance support: No upper extremity supported;Feet supported Sitting balance-Leahy Scale: Good     Standing balance support: Bilateral upper extremity supported Standing balance-Leahy Scale: Poor Standing balance comment: Reliant on RW for stability                            ADL either performed or assessed with clinical judgement   ADL Overall ADL's : Needs assistance/impaired Eating/Feeding: Set up;Sitting   Grooming: Set up;Sitting                   Toilet Transfer: Minimal assistance;Stand-pivot;BSC;RW   Toileting- Clothing Manipulation and Hygiene: Minimal assistance;Sit to/from stand         General ADL Comments: Pt completing toileting upon entering room, completes stand pivot transfer BSC>EOB; reviewed AE, compensatory techniques/strategies for completing ADLs after return home while adhering to back precautions; Pt with new lumbar corset this session however is slightly too big, paged ortho tech to adjust; reviewed bed mobility and safety at home during ADL/functional mobility completion                        Cognition Arousal/Alertness: Awake/alert Behavior During Therapy: [facility] for tasks assessed/performed Overall Cognitive Status: Within Functional Limits for tasks assessed                                                      General Comments Pt received new lumbar corsett brace however appears to be too  large - paged orthotech to adjust for fit     Pertinent Vitals/ Pain       Pain Assessment: Faces Faces Pain Scale: Hurts a little bit Pain Location: back   Pain Descriptors / Indicators: Sore;Guarding Pain Intervention(s): Limited activity within patient's tolerance;Repositioned;Monitored during session                                                          Frequency  Min 2X/week        Progress Toward Goals  OT Goals(current goals can now be found in the  care plan section)  Progress towards OT goals: Progressing toward goals  Acute Rehab OT Goals Patient Stated Goal: to go home  OT Goal Formulation: With patient/family Time For Goal Achievement: 06/09/17 Potential to Achieve Goals: Good  Plan Discharge plan remains appropriate                    AM-PAC PT "6 Clicks" Daily Activity     Outcome Measure   Help from another person eating meals?: None Help from another person taking care of personal grooming?: None Help from another person toileting, which includes using toliet, bedpan, or urinal?: A Lot Help from another person bathing (including washing, rinsing, drying)?: A Lot Help from another person to put on and taking off regular upper body clothing?: A Little Help from another person to put on and taking off regular lower body clothing?: A Lot 6 Click Score: 17    End of Session Equipment Utilized During Treatment: Back brace;Rolling walker  OT Visit Diagnosis: Other abnormalities of gait and mobility (R26.89)   Activity Tolerance Patient tolerated treatment well   Patient Left in bed;with call bell/phone within reach;with family/visitor present   Nurse Communication Other (comment);Mobility status (need for adjusting back brace )        Time: [id number] OT Time Calculation (min): 22 min  Charges: OT General Charges $OT Visit: 1 Procedure OT Treatments $Self Care/Home Management : 8-22 mins  Marcy SirenBreanna Fin Hupp, OT Pager [phone] 06/03/2017    Orlando PennerBreanna L Tu Bayle 06/03/2017, 4:55 PM

## 2017-06-03 NOTE — Discharge Instructions (Signed)
Ok to shower. Walk daily with  Walker.  See Dr. Ophelia CharterYates in 2 wks.

## 2017-06-03 NOTE — Progress Notes (Addendum)
   Subjective: 4 Days Post-Op Procedure(s) (LRB): Left L2-3 Transforaminal Lumbar Interbody Fusion, Pedicle Instrumentation, Cage, repair of dura (N/A) Patient reports pain as mild and moderate.  Cath times one after voiding several times for 850cc.  3:30 AM.   She has Hx of problems after surgery voiding as last 2 surgeries same problem.   Objective: Vital signs in last 24 hours: Temp:  [98.6 F (37 C)-99 F (37.2 C)] 99 F (37.2 C) (07/26 2030) Pulse Rate:  [92-95] 95 (07/26 2030) Resp:  [18] 18 (07/26 2030) BP: (120-124)/(53-65) 124/53 (07/26 2030) SpO2:  [92 %-99 %] 99 % (07/26 2030)  Intake/Output from previous day: 07/26 0701 - 07/27 0700 In: 720 [P.O.:720] Out: 1000 [Urine:1000] Intake/Output this shift: No intake/output data recorded.   Recent Labs  06/01/17 0534 06/02/17 0341  HGB 8.7* 8.8*    Recent Labs  06/01/17 0534 06/02/17 0341  WBC 9.9 10.0  RBC 3.18* 3.21*  HCT 26.6* 26.4*  PLT 165 174   No results for input(s): NA, K, CL, CO2, BUN, CREATININE, GLUCOSE, CALCIUM in the last 72 hours. No results for input(s): LABPT, INR in the last 72 hours.  Neurologically intact No results found.  Assessment/Plan: 4 Days Post-Op Procedure(s) (LRB): Left L2-3 Transforaminal Lumbar Interbody Fusion, Pedicle Instrumentation, Cage, repair of dura (N/A) Up with therapy, voiding monitoring   For POST OP URINARY RETENTION.      Dulcolax supp . If voiding well tomorrow then home. If not then home Saturday with foley and urology office followup next week.  She has voided twice since then.  Eldred MangesMark C Marieta Markov 06/03/2017, 9:48 AM

## 2017-06-04 NOTE — Anesthesia Postprocedure Evaluation (Signed)
Anesthesia Post Note  Patient: Joanna Castro  Procedure(s) Performed: Procedure(s) (LRB): Left L2-3 Transforaminal Lumbar Interbody Fusion, Pedicle Instrumentation, Cage, repair of dura (N/A)     Patient location during evaluation: PACU Anesthesia Type: General Level of consciousness: awake, awake and alert and oriented Pain management: pain level controlled Vital Signs Assessment: post-procedure vital signs reviewed and stable Respiratory status: spontaneous breathing, nonlabored ventilation and respiratory function stable Cardiovascular status: blood pressure returned to baseline Anesthetic complications: no    Last Vitals:  Vitals:   06/02/17 2030 06/03/17 1300  BP: (!) 124/53 118/62  Pulse: 95 93  Resp: 18 18  Temp: 37.2 C 37 C    Last Pain:  Vitals:   06/03/17 1300  TempSrc: Oral  PainSc:                  Rosela Supak COKER

## 2017-06-06 NOTE — Care Management Important Message (Signed)
Important Message  Patient Details  Name: Joanna Castro MRN: [id number] Date of Birth: 1946-08-29   Medicare Important Message Given:  Yes    Heston Widener [name] 06/06/2017, 8:44 AM

## 2017-06-08 ENCOUNTER — Telehealth (INDEPENDENT_AMBULATORY_CARE_PROVIDER_SITE_OTHER): Payer: Self-pay

## 2017-06-08 ENCOUNTER — Telehealth (INDEPENDENT_AMBULATORY_CARE_PROVIDER_SITE_OTHER): Payer: Self-pay | Admitting: *Deleted

## 2017-06-08 NOTE — Telephone Encounter (Signed)
Please advise. Patient has first post op appt on 06/14/2017.

## 2017-06-08 NOTE — Telephone Encounter (Signed)
Patient called and left a voicemail and states she had called earlier and noone has called her back. States also he leg has been acting up and is leaking from incision again.   cb [phone]

## 2017-06-08 NOTE — Telephone Encounter (Signed)
Dr. Ophelia CharterYates called and spoke with patient. She states that her leg pain is some better but that she is having some drainage from her incision. Dr. Ophelia CharterYates would like for the patient to come in tomorrow and have Dr. Otelia SergeantNitka check her incision.

## 2017-06-08 NOTE — Telephone Encounter (Signed)
Give it some time. Nerve is irritated from surgery and cage and screws.

## 2017-06-08 NOTE — Telephone Encounter (Signed)
Dr. Ophelia CharterYates called patient.

## 2017-06-08 NOTE — Telephone Encounter (Signed)
Pt called recently had L2-3 Transfrominal fusion and states that her L thigh and groin is feeling numb and tingling like it "on fire" says it doesn't go below the knee but when stands up it feels like her whole entire leg is weak. She is taking gabapentin for neuropathy but had to take extra 2400mg  4 times yesterday to help releif her but stated did not help her last night,she has been icing it. Wants to know what needs to be done. Please advise.   [phone]

## 2017-06-08 NOTE — Telephone Encounter (Signed)
Patient is on schedule to see Dr. Otelia SergeantNitka at 1000 in the morning. I did advise there may be a wait as she was being worked in to his schedule.  Thanks.

## 2017-06-09 ENCOUNTER — Ambulatory Visit (INDEPENDENT_AMBULATORY_CARE_PROVIDER_SITE_OTHER): Payer: Medicare HMO | Admitting: Specialist

## 2017-06-09 ENCOUNTER — Encounter (INDEPENDENT_AMBULATORY_CARE_PROVIDER_SITE_OTHER): Payer: Self-pay | Admitting: Specialist

## 2017-06-09 VITALS — BP 126/76 | HR 88 | Temp 97.3°F

## 2017-06-09 DIAGNOSIS — Z9889 Other specified postprocedural states: Secondary | ICD-10-CM

## 2017-06-09 DIAGNOSIS — G9600 Cerebrospinal fluid leak, unspecified: Secondary | ICD-10-CM

## 2017-06-09 DIAGNOSIS — Z981 Arthrodesis status: Secondary | ICD-10-CM

## 2017-06-09 MED ORDER — CEPHALEXIN 500 MG PO CAPS: 500.0000 mg | ORAL_CAPSULE | Freq: Four times a day (QID) | ORAL | 0 refills | Status: DC

## 2017-06-09 NOTE — Patient Instructions (Signed)
    Call if there is increasing drainage, fever greater than 101.5, severe head aches, and worsening nausea or light sensitivity. If shortness of breath, bloody cough or chest tightness or pain go to an emergency room. No lifting greater than 10 lbs. Avoid bending, stooping and twisting. Use brace when sitting and out of bed even to go to bathroom. Walk in house for first 2 weeks then may start to get out slowly increasing distances up to one mile by 4-6 weeks post op. No showering until drainage stops change dressing following bathing with shower.When bathing remove the brace shower and replace brace before getting out of the shower. If drainage, keep dry dressing and do not bathe the incision, use an moisture impervious dressing. Please call and return for scheduled follow up appointment 2 weeks from the time of surgery. Return tomorrow for examination.

## 2017-06-09 NOTE — Telephone Encounter (Signed)
Patient was seen in the office and labs were sent out for her

## 2017-06-09 NOTE — Discharge Summary (Signed)
Patient ID: Joanna Castro MRN: [id number] DOB/AGE: 72-Dec-1947 70 y.o.  Admit date: 05/30/2017 Discharge date: 06/09/2017  Admission Diagnoses:  Active Problems:   Lumbar stenosis with neurogenic claudication   Postoperative urinary retention   Discharge Diagnoses:  Active Problems:   Lumbar stenosis with neurogenic claudication   Postoperative urinary retention  status post Procedure(s): Left L2-3 Transforaminal Lumbar Interbody Fusion, Pedicle Instrumentation, Cage, repair of dura  Past Medical History:  Diagnosis Date  . Arthritis   . Fibromyalgia   . Glaucoma   . High cholesterol   . History of blood transfusion   . Hypertension   . Kidney cysts    effecting creat.  . Scoliosis   . Sleep apnea    intermittently uses cpap ( started 2 weeks ago)  . Spinal stenosis     Surgeries: Procedure(s): Left L2-3 Transforaminal Lumbar Interbody Fusion, Pedicle Instrumentation, Cage, repair of dura on 05/30/2017   Consultants:   Discharged Condition: Improved  Hospital Course: Joanna Castro is an 71 y.o. female who was admitted 05/30/2017 for operative treatment of lumbar stenosis. Patient failed conservative treatments (please see the history and physical for the specifics) and had severe unremitting pain that affects sleep, daily activities and work/hobbies. After pre-op clearance, the patient was taken to the operating room on 05/30/2017 and underwent  Procedure(s): Left L2-3 Transforaminal Lumbar Interbody Fusion, Pedicle Instrumentation, Cage, repair of dura.    Patient was given perioperative antibiotics:  Anti-infectives    Start     Dose/Rate Route Frequency Ordered Stop   05/30/17 2030  ceFAZolin (ANCEF) IVPB 1 g/50 mL premix     1 g 100 mL/hr over 30 Minutes Intravenous Every 8 hours 05/30/17 2028 05/31/17 0635   05/30/17 1000  ceFAZolin (ANCEF) IVPB 2g/100 mL premix     2 g 200 mL/hr over 30 Minutes Intravenous To [facility]Stay [facility] 05/27/17 1045 05/30/17  1403       Patient was given sequential compression devices and early ambulation to prevent DVT.   Patient benefited maximally from hospital stay and there were no complications. At the time of discharge, the patient was urinating/moving their bowels without difficulty, tolerating a regular diet, pain is controlled with oral pain medications and they have been cleared by PT/OT.   Recent vital signs: No data found.    Recent laboratory studies: No results for input(s): WBC, HGB, HCT, PLT, NA, K, CL, CO2, BUN, CREATININE, GLUCOSE, INR, CALCIUM in the last 72 hours.  Invalid input(s): PT, 2   Discharge Medications:   Allergies as of 06/03/2017      Reactions   No Known Allergies       Medication List    TAKE these medications   alendronate 70 MG tablet Commonly known as:  FOSAMAX Take 70 mg by mouth once a week. Sundays. Take with a full glass of water on an empty stomach.   amLODipine 10 MG tablet Commonly known as:  NORVASC Take 10 mg by mouth at bedtime.   cetirizine 10 MG tablet Commonly known as:  ZYRTEC Take 10 mg by mouth daily.   diclofenac 50 MG tablet Commonly known as:  CATAFLAM Take 50 mg by mouth 2 (two) times daily.   fluticasone 50 MCG/ACT nasal spray Commonly known as:  FLONASE Place 1 spray into both nostrils at bedtime as needed for allergies or rhinitis.   furosemide 20 MG tablet Commonly known as:  LASIX Take 20 mg by mouth 2 (two) times daily with breakfast  and lunch.   gabapentin 300 MG capsule Commonly known as:  NEURONTIN Take 600 mg by mouth 2 (two) times daily.   methocarbamol 750 MG tablet Commonly known as:  ROBAXIN Take 750 mg by mouth every 8 (eight) hours as needed for muscle spasms.   metolazone 5 MG tablet Commonly known as:  ZAROXOLYN Take 5 mg by mouth every other day.   multivitamin with minerals tablet Take 1 tablet by mouth daily.   oxyCODONE-acetaminophen 5-325 MG tablet Commonly known as:  ROXICET Take 1-2 tablets  by mouth every 4 (four) hours as needed for severe pain.   potassium chloride 10 MEQ tablet Commonly known as:  K-DUR Take 10 mEq by mouth daily. Per PCP to take prior to surgery for low potassium, pt unsure of dose   pravastatin 20 MG tablet Commonly known as:  PRAVACHOL Take 20 mg by mouth at bedtime.   timolol 0.5 % ophthalmic solution Commonly known as:  TIMOPTIC Place 1 drop into both eyes 2 (two) times daily.       Diagnostic Studies: Dg Chest 2 View  Result Date: 05/18/2017 CLINICAL DATA:  Preoperative assessment for lumbar spine surgery, history hypertension EXAM: CHEST  2 VIEW COMPARISON:  04/22/2014 FINDINGS: Normal heart size, mediastinal contours, and pulmonary vascularity. Lungs clear. No pleural effusion or pneumothorax. Prior cervical and lumbar fusions. Bones appear demineralized. IMPRESSION: No acute abnormalities. Electronically Signed   By: Ulyses SouthwardMark  Boles M.D.   On: 05/18/2017 13:25   Dg Lumbar Spine 2-3 Views  Result Date: 05/30/2017 CLINICAL DATA:  71 year old female with L2-3 fusion. Subsequent encounter. EXAM: DG C-ARM GT 120 MIN; LUMBAR SPINE - 2-3 VIEW COMPARISON:  12/28/2016 lumbar spine plain film exam and 01/01/2017 lumbar spine MR. FINDINGS: Two intraoperative C-arm views submitted for review after surgery. Scoliosis convex right. Previously patient noted to have bilateral pedicle screws and posterior connecting bar L3, L4 and L5 with interbody space L3-4 and L4-5. On the present exam, it appears some of the previous hardware has been removed with surgery and fusion at the L2-3 level. Is difficult to confirm that the pedicle screws are in proper position and a follow-up plain film exam is recommended for further delineation. IMPRESSION: On the present exam, it appears some of the previous hardware has been removed with surgery and fusion at the L2-3 level. Is difficult to confirm that the pedicle screws are in proper position and a follow-up plain film exam is  recommended for further delineation. These results will be called to the ordering clinician or representative by the Radiologist Assistant, and communication documented in the PACS or zVision Dashboard. Electronically Signed   By: Lacy DuverneySteven  Olson M.D.   On: 05/30/2017 17:52   Dg C-arm Gt 120 Min  Result Date: 05/30/2017 CLINICAL DATA:  71 year old female with L2-3 fusion. Subsequent encounter. EXAM: DG C-ARM GT 120 MIN; LUMBAR SPINE - 2-3 VIEW COMPARISON:  12/28/2016 lumbar spine plain film exam and 01/01/2017 lumbar spine MR. FINDINGS: Two intraoperative C-arm views submitted for review after surgery. Scoliosis convex right. Previously patient noted to have bilateral pedicle screws and posterior connecting bar L3, L4 and L5 with interbody space L3-4 and L4-5. On the present exam, it appears some of the previous hardware has been removed with surgery and fusion at the L2-3 level. Is difficult to confirm that the pedicle screws are in proper position and a follow-up plain film exam is recommended for further delineation. IMPRESSION: On the present exam, it appears some of the previous hardware  has been removed with surgery and fusion at the L2-3 level. Is difficult to confirm that the pedicle screws are in proper position and a follow-up plain film exam is recommended for further delineation. These results will be called to the ordering clinician or representative by the Radiologist Assistant, and communication documented in the PACS or zVision Dashboard. Electronically Signed   By: Lacy DuverneySteven  Olson M.D.   On: 05/30/2017 17:52    Discharge Instructions    Incentive spirometry RT    Complete by:  As directed       Follow-up Information    Eldred MangesYates, Mark C, MD Follow up in 2 week(s).   Specialty:  Orthopedic Surgery Contact information: [address] [place] [address] [phone]           Discharge Plan:  discharge to home  Disposition:     Signed: Zonia KiefJames Quention Mcneill 06/09/2017, 11:04  AM

## 2017-06-09 NOTE — Progress Notes (Signed)
   Post-Op Visit Note   Patient: Joanna Castro           Date of Birth: 1946-02-08           MRN: [id number] Visit Date: 06/09/2017 PCP: Charlette CaffeyParuchuri, Lakshmi P, MD   Assessment & Plan:8 days following lumbar decompression and fusion with TLIF and intra op CSF leak, now with drainge, Seroma vs CSF leak. Chief Complaint:  Chief Complaint  Patient presents with  . Lower Back - Routine Post Op, Wound Check, Dressing Change   Visit Diagnoses:  1. History of lumbar spinal fusion   2. CSF leak   3. History of incision and drainage   Drainage from the lower 1/3, 1cm area of the lumbar incision site. Serosangineous drainage.  Swab sent for C&S and 2cc sent for testing for CSF.  Plan: Call if there is increasing drainage, fever greater than 101.5, severe head aches, and worsening nausea or light sensitivity. If shortness of breath, bloody cough or chest tightness or pain go to an emergency room. No lifting greater than 10 lbs. Avoid bending, stooping and twisting. Use brace when sitting and out of bed even to go to bathroom. Walk in house for first 2 weeks then may start to get out slowly increasing distances up to one mile by 4-6 weeks post op. No showering until drainage stops change dressing following bathing with shower.When bathing remove the brace shower and replace brace before getting out of the shower. If drainage, keep dry dressing and do not bathe the incision, use an moisture impervious dressing. Please call and return for scheduled follow up appointment 2 weeks from the time of surgery. Return tomorrow for examination.  Follow-Up Instructions: Return in about 1 day (around 06/10/2017).   Orders:  No orders of the defined types were placed in this encounter.  No orders of the defined types were placed in this encounter.   Imaging: No results found.  PMFS History: Patient Active Problem List   Diagnosis Date Noted  . Postoperative urinary retention 06/03/2017  .  Lumbar stenosis with neurogenic claudication 05/30/2017   Past Medical History:  Diagnosis Date  . Arthritis   . Fibromyalgia   . Glaucoma   . High cholesterol   . History of blood transfusion   . Hypertension   . Kidney cysts    effecting creat.  . Scoliosis   . Sleep apnea    intermittently uses cpap ( started 2 weeks ago)  . Spinal stenosis     No family history on file.  Past Surgical History:  Procedure Laterality Date  . ABDOMINAL HYSTERECTOMY    . ANKLE FRACTURE SURGERY    . BACK SURGERY    . CERVICAL SPINE SURGERY  06/2016   [facility]  . CHOLECYSTECTOMY    . EYE SURGERY Bilateral    placed duct tubes  . FINGER SURGERY    . KNEE ARTHROPLASTY Right 04/29/2014   Procedure: COMPUTER ASSISTED TOTAL KNEE ARTHROPLASTY;  Surgeon: Eldred MangesMark C Yates, MD;  Location: [facility] OR;  Service: Orthopedics;  Laterality: Right;  Cemented Right Total Knee Arthroplasty   Social History   Occupational History  . Not on file.   Social History Main Topics  . Smoking status: Never Smoker  . Smokeless tobacco: Never Used  . Alcohol use Yes     Comment: rare  . Drug use: No  . Sexual activity: Yes    Birth control/ protection: None, Post-menopausal

## 2017-06-10 ENCOUNTER — Encounter (INDEPENDENT_AMBULATORY_CARE_PROVIDER_SITE_OTHER): Payer: Self-pay | Admitting: Specialist

## 2017-06-10 ENCOUNTER — Other Ambulatory Visit (INDEPENDENT_AMBULATORY_CARE_PROVIDER_SITE_OTHER): Payer: Self-pay | Admitting: Specialist

## 2017-06-10 ENCOUNTER — Other Ambulatory Visit (INDEPENDENT_AMBULATORY_CARE_PROVIDER_SITE_OTHER): Payer: Self-pay

## 2017-06-10 ENCOUNTER — Ambulatory Visit (HOSPITAL_COMMUNITY)
Admission: RE | Admit: 2017-06-10 | Discharge: 2017-06-10 | Disposition: A | Discharge status: Home / Self Care | Payer: Medicare HMO | Source: Ambulatory Visit | Attending: Specialist | Admitting: Specialist

## 2017-06-10 ENCOUNTER — Encounter (HOSPITAL_COMMUNITY): Payer: Self-pay

## 2017-06-10 ENCOUNTER — Ambulatory Visit (INDEPENDENT_AMBULATORY_CARE_PROVIDER_SITE_OTHER): Payer: Medicare HMO | Admitting: Specialist

## 2017-06-10 DIAGNOSIS — M545 Low back pain, unspecified: Secondary | ICD-10-CM

## 2017-06-10 DIAGNOSIS — T8189XD Other complications of procedures, not elsewhere classified, subsequent encounter: Secondary | ICD-10-CM

## 2017-06-10 DIAGNOSIS — M48062 Spinal stenosis, lumbar region with neurogenic claudication: Secondary | ICD-10-CM

## 2017-06-10 DIAGNOSIS — G9600 Cerebrospinal fluid leak, unspecified: Secondary | ICD-10-CM

## 2017-06-10 DIAGNOSIS — G8929 Other chronic pain: Secondary | ICD-10-CM | POA: Diagnosis not present

## 2017-06-10 DIAGNOSIS — Z981 Arthrodesis status: Secondary | ICD-10-CM | POA: Diagnosis not present

## 2017-06-10 DIAGNOSIS — M48061 Spinal stenosis, lumbar region without neurogenic claudication: Secondary | ICD-10-CM | POA: Insufficient documentation

## 2017-06-10 DIAGNOSIS — Q766 Other congenital malformations of ribs: Secondary | ICD-10-CM | POA: Insufficient documentation

## 2017-06-10 MED ORDER — LIDOCAINE HCL (PF) 1 % IJ SOLN
5.0000 mL | Freq: Once | INTRAMUSCULAR | Status: AC
  Administered 2017-06-10: 5 mL via INTRADERMAL

## 2017-06-10 MED ORDER — IOPAMIDOL (ISOVUE-M 200) INJECTION 41%
20.0000 mL | Freq: Once | INTRAMUSCULAR | Status: AC
  Administered 2017-06-10: 15 mL via INTRATHECAL

## 2017-06-10 NOTE — Patient Instructions (Addendum)
Avoid frequent bending and stooping  No lifting greater than 10 lbs. May use ice or moist heat for pain. Weight loss is of benefit. Keep dressing dry and change outer dressing and 4x4 if needed.  Return on Monday for a recheck. Will call you with the results of the myelogram and post myelogram CT scan.

## 2017-06-10 NOTE — Discharge Instructions (Signed)

## 2017-06-10 NOTE — Progress Notes (Signed)
No orders placed for pt pre procedure.  Radiology staff states PA will consent pt in radiology and no labs needed.

## 2017-06-10 NOTE — Progress Notes (Signed)
Office Visit Note   Patient: Joanna Castro           Date of Birth: Mar 06, 1946           MRN: [id number] Visit Date: 06/10/2017              Requested by: Charlette CaffeyParuchuri, Lakshmi P, MD [address] [place], [place] [address] PCP: Charlette CaffeyParuchuri, Lakshmi P, MD   Assessment & Plan: 11 days following lumbar decompression and fusion L2-3 Persistent drainage from lower lumbar incision site.  Visit Diagnoses:  1. Lumbar surgical wound fluid collection, subsequent encounter   2. Spinal stenosis of lumbar region with neurogenic claudication   Dressing changed, 1/4 inch opening at the lower 1/3 of the incision site, draining browish, thin clear fluid in much smaller amounts today. Incision probes to the lumbodorsal fascial layer but no deeper. The wound is contained to about 1.5 inches below skin and extends proximal only  A distance of 1 inch and distally 0.5 inches. This was packed with iodoform.   Plan: Avoid frequent bending and stooping  No lifting greater than 10 lbs. May use ice or moist heat for pain. Weight loss is of benefit. WIll schedule for a myelogram and post myelogram CT Scan to evaluate for an active CSF leak. The Beta 2 transferrin test unfortunately can not be done stat and may take several days to return. Will cancel the test if possible and use the information from the myelogram to determine if she need to be brought into the  Hospital for a formal incision, drainage and debridement with exploration of the surgical incision and dural repair site. Keep dressing dry and change outer dressing and 4x4 if needed.  Return on Monday for a recheck. Will call you with the results of the myelogram and post myelogram CT scan.  Follow-Up Instructions: No Follow-up on file.   Orders:  No orders of the defined types were placed in this encounter.  No orders of the defined types were placed in this encounter.     Procedures: No procedures performed   Clinical Data: No additional  findings.   Subjective: No chief complaint on file.   HPI  Review of Systems   Objective: Vital Signs: There were no vitals taken for this visit.  Physical Exam  Ortho Exam  Specialty Comments:  No specialty comments available.  Imaging: No results found.   PMFS History: Patient Active Problem List   Diagnosis Date Noted  . Postoperative urinary retention 06/03/2017  . Lumbar stenosis with neurogenic claudication 05/30/2017   Past Medical History:  Diagnosis Date  . Arthritis   . Fibromyalgia   . Glaucoma   . High cholesterol   . History of blood transfusion   . Hypertension   . Kidney cysts    effecting creat.  . Scoliosis   . Sleep apnea    intermittently uses cpap ( started 2 weeks ago)  . Spinal stenosis     No family history on file.  Past Surgical History:  Procedure Laterality Date  . ABDOMINAL HYSTERECTOMY    . ANKLE FRACTURE SURGERY    . BACK SURGERY    . CERVICAL SPINE SURGERY  06/2016   [facility]  . CHOLECYSTECTOMY    . EYE SURGERY Bilateral    placed duct tubes  . FINGER SURGERY    . KNEE ARTHROPLASTY Right 04/29/2014   Procedure: COMPUTER ASSISTED TOTAL KNEE ARTHROPLASTY;  Surgeon: Eldred MangesMark C Yates, MD;  Location: [facility] OR;  Service: Orthopedics;  Laterality: Right;  Cemented Right Total Knee Arthroplasty   Social History   Occupational History  . Not on file.   Social History Main Topics  . Smoking status: Never Smoker  . Smokeless tobacco: Never Used  . Alcohol use Yes     Comment: rare  . Drug use: No  . Sexual activity: Yes    Birth control/ protection: None, Post-menopausal

## 2017-06-12 LAB — WOUND CULTURE
GRAM STAIN: NONE SEEN
GRAM STAIN: NONE SEEN
Gram Stain: NONE SEEN
ORGANISM ID, BACTERIA: NORMAL

## 2017-06-13 ENCOUNTER — Encounter (INDEPENDENT_AMBULATORY_CARE_PROVIDER_SITE_OTHER): Payer: Self-pay | Admitting: Specialist

## 2017-06-13 ENCOUNTER — Ambulatory Visit (INDEPENDENT_AMBULATORY_CARE_PROVIDER_SITE_OTHER): Payer: Medicare HMO | Admitting: Specialist

## 2017-06-13 VITALS — BP 129/69 | HR 81

## 2017-06-13 DIAGNOSIS — Z981 Arthrodesis status: Secondary | ICD-10-CM

## 2017-06-13 NOTE — Progress Notes (Signed)
   Post-Op Visit Note   Patient: Joanna Castro           Date of Birth: 05/01/1946           MRN: [id number] Visit Date: 06/13/2017 PCP: Charlette CaffeyParuchuri, Lakshmi P, MD   Assessment & Plan: 2 weeks following L2-3 TLIF for spinal stenosis and spondylolisthesis with draining open lower incision due to seroma or hematoma   Chief Complaint:  Chief Complaint  Patient presents with  . Lower Back - Wound Check  Incision with minimal active drainage, daughter changed outer dressing yesterday.  New dressing today packed with NS wet to dry 1/4" packing , plain nu gauze.  Visit Diagnoses:  1. S/P lumbar fusion     Plan: Will schedule for HHN to eval for daily dressings normal saline wet to dry.  Has adequate pain medication, is taking gabapentin 600 mg TID. Returning tomorrow for dressings with Dr. Ophelia CharterYates.   Follow-Up Instructions: No Follow-up on file.   Orders:  No orders of the defined types were placed in this encounter.  No orders of the defined types were placed in this encounter.   Imaging: No results found.  PMFS History: Patient Active Problem List   Diagnosis Date Noted  . Postoperative urinary retention 06/03/2017  . Lumbar stenosis with neurogenic claudication 05/30/2017   Past Medical History:  Diagnosis Date  . Arthritis   . Fibromyalgia   . Glaucoma   . High cholesterol   . History of blood transfusion   . Hypertension   . Kidney cysts    effecting creat.  . Scoliosis   . Sleep apnea    intermittently uses cpap ( started 2 weeks ago)  . Spinal stenosis     No family history on file.  Past Surgical History:  Procedure Laterality Date  . ABDOMINAL HYSTERECTOMY    . ANKLE FRACTURE SURGERY    . BACK SURGERY    . CERVICAL SPINE SURGERY  06/2016   [facility]  . CHOLECYSTECTOMY    . EYE SURGERY Bilateral    placed duct tubes  . FINGER SURGERY    . KNEE ARTHROPLASTY Right 04/29/2014   Procedure: COMPUTER ASSISTED TOTAL KNEE ARTHROPLASTY;  Surgeon:  Eldred MangesMark C Yates, MD;  Location: [facility] OR;  Service: Orthopedics;  Laterality: Right;  Cemented Right Total Knee Arthroplasty   Social History   Occupational History  . Not on file.   Social History Main Topics  . Smoking status: Never Smoker  . Smokeless tobacco: Never Used  . Alcohol use Yes     Comment: rare  . Drug use: No  . Sexual activity: Yes    Partners: Male    Birth control/ protection: None, Post-menopausal

## 2017-06-14 ENCOUNTER — Ambulatory Visit (INDEPENDENT_AMBULATORY_CARE_PROVIDER_SITE_OTHER): Payer: Medicare HMO | Admitting: Orthopaedic Surgery

## 2017-06-14 ENCOUNTER — Encounter (INDEPENDENT_AMBULATORY_CARE_PROVIDER_SITE_OTHER): Payer: Self-pay | Admitting: Orthopaedic Surgery

## 2017-06-14 VITALS — BP 126/72 | Ht 65.0 in | Wt 192.0 lb

## 2017-06-14 DIAGNOSIS — L24A9 Irritant contact dermatitis due friction or contact with other specified body fluids: Secondary | ICD-10-CM

## 2017-06-14 DIAGNOSIS — Z981 Arthrodesis status: Secondary | ICD-10-CM

## 2017-06-14 DIAGNOSIS — T148XXA Other injury of unspecified body region, initial encounter: Secondary | ICD-10-CM

## 2017-06-14 NOTE — Progress Notes (Signed)
Post-Op Visit Note   Patient: Joanna Castro           Date of Birth: 27-Jun-1946           MRN: [id number] Visit Date: 06/14/2017 PCP: Charlette CaffeyParuchuri, Lakshmi P, MD   Assessment & Plan:  Chief Complaint:  Chief Complaint  Patient presents with  . Lower Back - Follow-up    05/30/17 L2-3 pedicle instrumentation cage, repair of dura   Patient returns. States that she is doing well. Drainage has decreased. Was seen by Dr. Otelia SergeantNitka yesterday.  Denies fever chills. Continues to have left thigh pain that she did not have preop. CT lumbar spine 06/11/2015 report read no definitive or brisk CSF leak. No contrast seen extending to the leaking lower incision where there is subcutaneous packing. Recent L2-3 discectomy. The left L2 pedicle encroaches on the left subarticular recessed. Just below this pedicle screw the region of recent dural repair there is minimal epidural high density which could be contrast or bone. It ongoing wound leakage a noncontrast CT early next week may be able to differentiate. Left L2 pedicle fracture. Right subarticular recess high density at L3-4 is likely postoperative ossification. Attention to this area is a follow-up CT is needed. Subfascial incisional fluid and gas that is nonspecific. Hypoplastic ribs at T12. Spinal membrane as previously established.    Visit Diagnoses:  1. Status post lumbar spinal fusion   2. Drainage from wound     Plan: Patient follow up in 1 week for wound check. Out of formed packing replaced today and new dressing applied. Patient's daughter will do daily dressing changes and iodoform gauze given. Dr. Ophelia CharterYates did review CT.      Follow-Up Instructions: Return in about 1 week (around 06/21/2017) for Dr. Ophelia CharterYates for wound check.   Orders:  No orders of the defined types were placed in this encounter.  No orders of the defined types were placed in this encounter.   Imaging: No results found.  PMFS History: Patient Active Problem List   Diagnosis Date Noted  . Postoperative urinary retention 06/03/2017  . Lumbar stenosis with neurogenic claudication 05/30/2017   Past Medical History:  Diagnosis Date  . Arthritis   . Fibromyalgia   . Glaucoma   . High cholesterol   . History of blood transfusion   . Hypertension   . Kidney cysts    effecting creat.  . Scoliosis   . Sleep apnea    intermittently uses cpap ( started 2 weeks ago)  . Spinal stenosis     No family history on file.  Past Surgical History:  Procedure Laterality Date  . ABDOMINAL HYSTERECTOMY    . ANKLE FRACTURE SURGERY    . BACK SURGERY    . CERVICAL SPINE SURGERY  06/2016   [facility]  . CHOLECYSTECTOMY    . EYE SURGERY Bilateral    placed duct tubes  . FINGER SURGERY    . KNEE ARTHROPLASTY Right 04/29/2014   Procedure: COMPUTER ASSISTED TOTAL KNEE ARTHROPLASTY;  Surgeon: Eldred MangesMark C Yates, MD;  Location: [facility] OR;  Service: Orthopedics;  Laterality: Right;  Cemented Right Total Knee Arthroplasty   Social History   Occupational History  . Not on file.   Social History Main Topics  . Smoking status: Never Smoker  . Smokeless tobacco: Never Used  . Alcohol use Yes     Comment: rare  . Drug use: No  . Sexual activity: Yes    Partners: Male  Birth control/ protection: None, Post-menopausal   Exam Patient is to have some serous drainage from the small open wound. I did remove iodoform packing today and new packing was put back in. Neurologically intact.

## 2017-06-14 NOTE — Progress Notes (Signed)
Culture results negative.

## 2017-06-16 ENCOUNTER — Telehealth (INDEPENDENT_AMBULATORY_CARE_PROVIDER_SITE_OTHER): Payer: Self-pay

## 2017-06-16 NOTE — Telephone Encounter (Signed)
YES ok THANKS

## 2017-06-16 NOTE — Telephone Encounter (Signed)
IC patient and she is asking if you will allow her to drive next week.  She and her mother have doctors appts and she will need to make arrangements for transportation if you don't let her drive.  She is not taking any narcotics, and she is only takinf 600mg  gabapentin BID now.  Please advise.

## 2017-06-16 NOTE — Telephone Encounter (Signed)
Patient would like a call back.  Stated she had some questions.  Cb# is [phone].  Please advise.  Thank You.

## 2017-06-17 NOTE — Telephone Encounter (Signed)
IC and advised patient.

## 2017-06-20 ENCOUNTER — Telehealth (INDEPENDENT_AMBULATORY_CARE_PROVIDER_SITE_OTHER): Payer: Self-pay | Admitting: Orthopaedic Surgery

## 2017-06-20 DIAGNOSIS — G9600 Cerebrospinal fluid leak, unspecified: Secondary | ICD-10-CM

## 2017-06-20 DIAGNOSIS — Z9889 Other specified postprocedural states: Secondary | ICD-10-CM

## 2017-06-20 MED ORDER — CEPHALEXIN 500 MG PO CAPS: 500.0000 mg | ORAL_CAPSULE | Freq: Four times a day (QID) | ORAL | 0 refills | Status: DC

## 2017-06-20 NOTE — Telephone Encounter (Signed)
[facility]. Ok refill Keflex thanks

## 2017-06-20 NOTE — Addendum Note (Signed)
Addended by: Cherre HugerMAY, Krista Godsil E on: 06/20/2017 04:34 PM   Modules accepted: Orders

## 2017-06-20 NOTE — Telephone Encounter (Signed)
Patient called asking if she needs to continue taking her antibiotic. CB # [phone]

## 2017-06-20 NOTE — Telephone Encounter (Signed)
IC patient and advised yes, and Rx refilled.

## 2017-06-20 NOTE — Telephone Encounter (Signed)
Please advise 

## 2017-06-24 ENCOUNTER — Ambulatory Visit (INDEPENDENT_AMBULATORY_CARE_PROVIDER_SITE_OTHER): Payer: Medicare HMO | Admitting: Orthopaedic Surgery

## 2017-06-24 ENCOUNTER — Encounter (INDEPENDENT_AMBULATORY_CARE_PROVIDER_SITE_OTHER): Payer: Self-pay | Admitting: Orthopaedic Surgery

## 2017-06-24 VITALS — BP 129/84 | HR 79 | Ht 65.0 in | Wt 192.0 lb

## 2017-06-24 DIAGNOSIS — M48062 Spinal stenosis, lumbar region with neurogenic claudication: Secondary | ICD-10-CM

## 2017-06-24 NOTE — Progress Notes (Signed)
   Post-Op Visit Note   Patient: Joanna Castro           Date of Birth: December 17, 1945           MRN: [id number] Visit Date: 06/24/2017 PCP: Charlette Caffey, MD   Assessment & Plan: Post lumbar decompression and fusion with seroma. She has a 2 mm area and we are still waking this. No cellulitis. She is walking better and states her leg pain since last visit has significantly improved. She is going to [employer] football game in a few weeks. I will recheck her in 3 weeks.  Chief Complaint:  Chief Complaint  Patient presents with  . Lower Back - Wound Check   Visit Diagnoses: Postop lumbar fusion with subcutaneous seroma. No evidence of infection.  Plan: Continue dressing changes and return in 3 weeks.  Follow-Up Instructions: Return in about 3 weeks (around 07/15/2017).   Orders:  No orders of the defined types were placed in this encounter.  No orders of the defined types were placed in this encounter.   Imaging: No results found.  PMFS History: Patient Active Problem List   Diagnosis Date Noted  . Postoperative urinary retention 06/03/2017  . Lumbar stenosis with neurogenic claudication 05/30/2017   Past Medical History:  Diagnosis Date  . Arthritis   . Fibromyalgia   . Glaucoma   . High cholesterol   . History of blood transfusion   . Hypertension   . Kidney cysts    effecting creat.  . Scoliosis   . Sleep apnea    intermittently uses cpap ( started 2 weeks ago)  . Spinal stenosis     No family history on file.  Past Surgical History:  Procedure Laterality Date  . ABDOMINAL HYSTERECTOMY    . ANKLE FRACTURE SURGERY    . BACK SURGERY    . CERVICAL SPINE SURGERY  06/2016   [facility]  . CHOLECYSTECTOMY    . EYE SURGERY Bilateral    placed duct tubes  . FINGER SURGERY    . KNEE ARTHROPLASTY Right 04/29/2014   Procedure: COMPUTER ASSISTED TOTAL KNEE ARTHROPLASTY;  Surgeon: Eldred Manges, MD;  Location: [facility] OR;  Service: Orthopedics;   Laterality: Right;  Cemented Right Total Knee Arthroplasty   Social History   Occupational History  . Not on file.   Social History Main Topics  . Smoking status: Never Smoker  . Smokeless tobacco: Never Used  . Alcohol use Yes     Comment: rare  . Drug use: No  . Sexual activity: Yes    Partners: Male    Birth control/ protection: None, Post-menopausal

## 2017-07-15 ENCOUNTER — Ambulatory Visit (INDEPENDENT_AMBULATORY_CARE_PROVIDER_SITE_OTHER): Payer: Medicare HMO | Admitting: Orthopaedic Surgery

## 2017-07-15 ENCOUNTER — Ambulatory Visit (INDEPENDENT_AMBULATORY_CARE_PROVIDER_SITE_OTHER): Payer: Medicare HMO

## 2017-07-15 ENCOUNTER — Encounter (INDEPENDENT_AMBULATORY_CARE_PROVIDER_SITE_OTHER): Payer: Self-pay | Admitting: Orthopaedic Surgery

## 2017-07-15 VITALS — BP 138/83 | HR 75 | Ht 65.0 in | Wt 192.0 lb

## 2017-07-15 DIAGNOSIS — Z981 Arthrodesis status: Secondary | ICD-10-CM

## 2017-07-15 NOTE — Progress Notes (Signed)
   Post-Op Visit Note   Patient: Joanna Castro           Date of Birth: 06-22-1946           MRN: [id number] Visit Date: 07/15/2017 PCP: Charlette CaffeyParuchuri, Lakshmi P, MD   Assessment & Plan: Patient had postoperative serous collection which has healed. Incision looks good no erythema no prominence. CT scan postop show satisfactory position no evidence of dural leakage in satisfactory position of cage and hardware. She stopped wearing her brace and has had some symptoms when she bends or twists a lot. She'll continue with her walking program. I'll recheck her again in 2 months.  Chief Complaint:  Chief Complaint  Patient presents with  . Lower Back - Wound Check   Visit Diagnoses:  1. S/P lumbar fusion     Plan: Post L2-3 left T left still has some pain left adjacent and just above the iliac crest on the left side. Cages in good position bone graft looks good no loosening of the screws she is walking with a cane [place] [employer] [place] and is happy about that. Patient had a dural repair at the time of surgery. Incision was good to continue walking and gradually increase her distance to improve her stamina. Good strength in her legs and return in 2 months. AP lateral lumbar x-ray on return.   Follow-Up Instructions: Return in about 2 months (around 09/14/2017).   Orders:  Orders Placed This Encounter  Procedures  . XR Lumbar Spine 2-3 Views   No orders of the defined types were placed in this encounter.   Imaging: No results found.  PMFS History: Patient Active Problem List   Diagnosis Date Noted  . S/P lumbar fusion 07/18/2017  . Postoperative urinary retention 06/03/2017  . Lumbar stenosis with neurogenic claudication 05/30/2017   Past Medical History:  Diagnosis Date  . Arthritis   . Fibromyalgia   . Glaucoma   . High cholesterol   . History of blood transfusion   . Hypertension   . Kidney cysts    effecting creat.  . Scoliosis   . Sleep apnea    intermittently uses cpap ( started 2 weeks ago)  . Spinal stenosis     No family history on file.  Past Surgical History:  Procedure Laterality Date  . ABDOMINAL HYSTERECTOMY    . ANKLE FRACTURE SURGERY    . BACK SURGERY    . CERVICAL SPINE SURGERY  06/2016   [facility]  . CHOLECYSTECTOMY    . EYE SURGERY Bilateral    placed duct tubes  . FINGER SURGERY    . KNEE ARTHROPLASTY Right 04/29/2014   Procedure: COMPUTER ASSISTED TOTAL KNEE ARTHROPLASTY;  Surgeon: Eldred MangesMark C Arriona Prest, MD;  Location: [facility] OR;  Service: Orthopedics;  Laterality: Right;  Cemented Right Total Knee Arthroplasty   Social History   Occupational History  . Not on file.   Social History Main Topics  . Smoking status: Never Smoker  . Smokeless tobacco: Never Used  . Alcohol use Yes     Comment: rare  . Drug use: No  . Sexual activity: Yes    Partners: Male    Birth control/ protection: None, Post-menopausal

## 2017-07-18 DIAGNOSIS — Z981 Arthrodesis status: Secondary | ICD-10-CM | POA: Insufficient documentation

## 2017-09-20 ENCOUNTER — Ambulatory Visit (INDEPENDENT_AMBULATORY_CARE_PROVIDER_SITE_OTHER): Payer: Medicare HMO | Admitting: Orthopaedic Surgery

## 2017-09-21 ENCOUNTER — Ambulatory Visit (INDEPENDENT_AMBULATORY_CARE_PROVIDER_SITE_OTHER): Payer: Medicare HMO | Admitting: Orthopaedic Surgery

## 2017-09-21 ENCOUNTER — Encounter (INDEPENDENT_AMBULATORY_CARE_PROVIDER_SITE_OTHER): Payer: Self-pay | Admitting: Orthopaedic Surgery

## 2017-09-21 ENCOUNTER — Ambulatory Visit (INDEPENDENT_AMBULATORY_CARE_PROVIDER_SITE_OTHER): Payer: Medicare HMO

## 2017-09-21 VITALS — BP 122/77 | HR 73 | Ht 63.5 in | Wt 184.0 lb

## 2017-09-21 DIAGNOSIS — Z981 Arthrodesis status: Secondary | ICD-10-CM | POA: Diagnosis not present

## 2017-09-21 NOTE — Progress Notes (Signed)
Office Visit Note   Patient: Joanna Castro           Date of Birth: 04-Jul-1946           MRN: [id number] Visit Date: 09/21/2017              Requested by: Charlette CaffeyParuchuri, Lakshmi P, MD [address] [place], [place] [address] PCP: Charlette CaffeyParuchuri, Lakshmi P, MD   Assessment & Plan: Visit Diagnoses:  1. S/P lumbar fusion     Plan: Prescription given for single visit for home exercise program.  She takes care of her elderly mother is also working , does all the shopping etc.  She states she does not have time to go to physical therapy regularly but could go for a single visit for home exercise program to work on left quad strengthening.  Follow-up here will be as needed.  Follow-Up Instructions: Return if symptoms worsen or fail to improve.   Orders:  Orders Placed This Encounter  Procedures  . XR Lumbar Spine 2-3 Views   No orders of the defined types were placed in this encounter.     Procedures: No procedures performed   Clinical Data: No additional findings.   Subjective: Chief Complaint  Patient presents with  . Lower Back - Follow-up    HPI 71 year old female returns post removal of hardware L3-L5 and L2-3 fusion with left TLIF.  Patient is back working she ambulates with a cane.  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  She does this part-time.  Got good relief of her leg pain.  She still has a scoliosis above the fusion site.  She is a Tourist information centre managercommunity ambulator.  She does not walk without her cane.  Review of Systems reviewed and updated 14 point unchanged since her last office visit and surgery in July 2018.  She is on diclofenac and gabapentin.  She is not on any narcotics.  No bowel bladder symptoms no leg pain.  Objective: Vital Signs: BP 122/77   Pulse 73   Ht 5' 3.5" (1.613 m)   Wt 184 lb (83.5 kg)   BMI 32.08 kg/m   Physical Exam  Constitutional: She is oriented to person, place, and time. She appears  well-developed.  HENT:  Head: Normocephalic.  Right Ear: External ear normal.  Left Ear: External ear normal.  Eyes: Pupils are equal, round, and reactive to light.  Neck: No tracheal deviation present. No thyromegaly present.  Cardiovascular: Normal rate.  Pulmonary/Chest: Effort normal.  Abdominal: Soft.  Neurological: She is alert and oriented to person, place, and time.  Skin: Skin is warm and dry.  Psychiatric: She has a normal mood and affect. Her behavior is normal.    Ortho Exam patient has some left quad weakness and slight left hip flexor weakness.  Adductor and hip abductor are strong.  Ankle dorsiflexion plantarflexion is normal.  Mild pelvic obliquity from her scoliosis.  No pedal edema.  Lumbar incision is well-healed no tenderness.  Specialty Comments:  No specialty comments available.  Imaging: Xr Lumbar Spine 2-3 Views  Result Date: 09/21/2017 2 view lumbar x-rays obtained.  This shows cage at L2-3 in good position.  Pedicle screws present.  On the left side screws are at L2 and L4 on the right side pedicles at L2 and L3.  Hardware is in good position. Impression: Postop L2-3 fusion with pedicle instrumentation.  No evidence of loosening.  Progressive bony incorporation  at the L2-3 level.    PMFS History: Patient Active Problem List   Diagnosis Date Noted  . S/P lumbar fusion 07/18/2017  . Postoperative urinary retention 06/03/2017  . Lumbar stenosis with neurogenic claudication 05/30/2017   Past Medical History:  Diagnosis Date  . Arthritis   . Fibromyalgia   . Glaucoma   . High cholesterol   . History of blood transfusion   . Hypertension   . Kidney cysts    effecting creat.  . Scoliosis   . Sleep apnea    intermittently uses cpap ( started 2 weeks ago)  . Spinal stenosis     No family history on file.  Past Surgical History:  Procedure Laterality Date  . ABDOMINAL HYSTERECTOMY    . ANKLE FRACTURE SURGERY    . BACK SURGERY    . CERVICAL SPINE  SURGERY  06/2016   [facility]  . CHOLECYSTECTOMY    . EYE SURGERY Bilateral    placed duct tubes  . FINGER SURGERY     Social History   Occupational History  . Not on file  Tobacco Use  . Smoking status: Never Smoker  . Smokeless tobacco: Never Used  Substance and Sexual Activity  . Alcohol use: Yes    Comment: rare  . Drug use: No  . Sexual activity: Yes    Partners: Male    Birth control/protection: None, Post-menopausal

## 2017-10-13 ENCOUNTER — Emergency Department (HOSPITAL_BASED_OUTPATIENT_CLINIC_OR_DEPARTMENT_OTHER): Payer: Medicare HMO

## 2017-10-13 ENCOUNTER — Other Ambulatory Visit: Payer: Self-pay

## 2017-10-13 ENCOUNTER — Emergency Department (HOSPITAL_BASED_OUTPATIENT_CLINIC_OR_DEPARTMENT_OTHER)
Admission: EM | Admit: 2017-10-13 | Discharge: 2017-10-13 | Disposition: A | Discharge status: Home / Self Care | Payer: Medicare HMO | Attending: Emergency Medicine | Admitting: Emergency Medicine

## 2017-10-13 ENCOUNTER — Encounter (HOSPITAL_BASED_OUTPATIENT_CLINIC_OR_DEPARTMENT_OTHER): Payer: Self-pay

## 2017-10-13 DIAGNOSIS — I10 Essential (primary) hypertension: Secondary | ICD-10-CM | POA: Diagnosis not present

## 2017-10-13 DIAGNOSIS — R0789 Other chest pain: Secondary | ICD-10-CM | POA: Insufficient documentation

## 2017-10-13 DIAGNOSIS — Y999 Unspecified external cause status: Secondary | ICD-10-CM | POA: Diagnosis not present

## 2017-10-13 DIAGNOSIS — R079 Chest pain, unspecified: Secondary | ICD-10-CM | POA: Diagnosis present

## 2017-10-13 DIAGNOSIS — W01198A Fall on same level from slipping, tripping and stumbling with subsequent striking against other object, initial encounter: Secondary | ICD-10-CM | POA: Insufficient documentation

## 2017-10-13 DIAGNOSIS — Y9389 Activity, other specified: Secondary | ICD-10-CM | POA: Diagnosis not present

## 2017-10-13 DIAGNOSIS — W19XXXA Unspecified fall, initial encounter: Secondary | ICD-10-CM

## 2017-10-13 DIAGNOSIS — E785 Hyperlipidemia, unspecified: Secondary | ICD-10-CM | POA: Diagnosis not present

## 2017-10-13 DIAGNOSIS — Y929 Unspecified place or not applicable: Secondary | ICD-10-CM | POA: Insufficient documentation

## 2017-10-13 NOTE — ED Triage Notes (Signed)
C/o trip/fall approx 11am-pain to both knees, left shoulder and chest area-states "feel like hard to breathe'-NAD-steady gait with own cane

## 2017-10-13 NOTE — ED Provider Notes (Signed)
Emergency Department Provider Note   I have reviewed the triage vital signs and the nursing notes.   HISTORY  Chief Complaint Fall   HPI Joanna Leysrudy T Castro is a 71 y.o. female with PMH of HLD, HTN, and OSA presents to the emergency department for evaluation after mechanical fall today.  The patient states that she tripped on the concrete and fell forward striking her knees herself with her hands.  Denies head trauma or loss of consciousness.  She continued shopping and has been ambulatory since the fall but has had some lingering pain in the chest.  Pain is moderate and worse with movement.  She feels some pain with breathing deeply.  No chest pain at rest.  She reports mild soreness in her knees but states she has been up and walking around today and they do not hurt more than normal.   Past Medical History:  Diagnosis Date  . Arthritis   . Fibromyalgia   . Glaucoma   . High cholesterol   . History of blood transfusion   . Hypertension   . Kidney cysts    effecting creat.  . Scoliosis   . Sleep apnea    intermittently uses cpap ( started 2 weeks ago)  . Spinal stenosis     Patient Active Problem List   Diagnosis Date Noted  . S/P lumbar fusion 07/18/2017  . Postoperative urinary retention 06/03/2017  . Lumbar stenosis with neurogenic claudication 05/30/2017    Past Surgical History:  Procedure Laterality Date  . ABDOMINAL HYSTERECTOMY    . ANKLE FRACTURE SURGERY    . BACK SURGERY    . CERVICAL SPINE SURGERY  06/2016   [facility]  . CHOLECYSTECTOMY    . EYE SURGERY Bilateral    placed duct tubes  . FINGER SURGERY    . KNEE ARTHROPLASTY Right 04/29/2014   Procedure: COMPUTER ASSISTED TOTAL KNEE ARTHROPLASTY;  Surgeon: Eldred MangesMark C Yates, MD;  Location: [facility] OR;  Service: Orthopedics;  Laterality: Right;  Cemented Right Total Knee Arthroplasty    Current Outpatient Rx  . Order #: [id number] Class: Historical Med  . Order #: [id number] Class: Historical Med  . Order #:  [id number] Class: Normal  . Order #: [id number] Class: Historical Med  . Order #: [id number] Class: Historical Med  . Order #: [id number] Class: Historical Med  . Order #: [id number] Class: Historical Med  . Order #: [id number] Class: Historical Med  . Order #: [id number] Class: Historical Med  . Order #: [id number] Class: Historical Med  . Order #: [id number] Class: Historical Med  . Order #: [id number] Class: Print  . Order #: [id number] Class: Historical Med  . Order #: [id number] Class: Historical Med    Allergies No known allergies  No family history on file.  Social History Social History   Tobacco Use  . Smoking status: Never Smoker  . Smokeless tobacco: Never Used  Substance Use Topics  . Alcohol use: Yes    Comment: rare  . Drug use: No    Review of Systems  Constitutional: No fever/chills Eyes: No visual changes. ENT: No sore throat. Cardiovascular: Positive left lateral chest wall pain.  Respiratory: Denies shortness of breath. Gastrointestinal: No abdominal pain.  No nausea, no vomiting.  No diarrhea.  No constipation. Genitourinary: Negative for dysuria. Musculoskeletal: Negative for back pain. Skin: Negative for rash. Neurological: Negative for headaches, focal weakness or numbness.  10-point ROS otherwise negative.  ____________________________________________   PHYSICAL EXAM:  VITAL SIGNS: ED Triage Vitals  Enc Vitals Group     BP 10/13/17 1604 Marland Kitchen(!)  145/87     Pulse Rate 10/13/17 1604 77     Resp 10/13/17 1604 20     Temp 10/13/17 1604 98.3 F (36.8 C)     Temp Source 10/13/17 1604 Oral     SpO2 10/13/17 1604 100 %     Weight 10/13/17 1604 195 lb (88.5 kg)     Height 10/13/17 1604 5\' 3"  (1.6 m)     Pain Score 10/13/17 1602 8   Constitutional: Alert and oriented. Well appearing and in no acute distress. Eyes: Conjunctivae are normal.  Head: Atraumatic. Nose: No congestion/rhinnorhea. Mouth/Throat: Mucous membranes are moist.  Oropharynx non-erythematous. Neck:  No stridor.   Cardiovascular: Normal rate, regular rhythm. Good peripheral circulation. Grossly normal heart sounds.   Respiratory: Normal respiratory effort.  No retractions. Lungs CTAB. Gastrointestinal: Soft and nontender. No distention.  Musculoskeletal: No lower extremity tenderness nor edema. No gross deformities of extremities. Normal ROM of upper and lower extremities. No clavicle, shoulder, elbow or wrist tenderness to palpation. No tenderness to palpation of bilateral knees and ankles.  Neurologic:  Normal speech and language. No gross focal neurologic deficits are appreciated.  Skin:  Skin is warm, dry and intact. No rash noted.  ____________________________________________  RADIOLOGY  Dg Ribs Unilateral W/chest Left  Result Date: 10/13/2017 CLINICAL DATA:  Fall.  Left-sided pain. EXAM: LEFT RIBS AND CHEST - 3+ VIEW COMPARISON:  05/18/2017 . FINDINGS: Mediastinum hilar structures normal. Heart size normal. Lungs are clear. No pleural effusion or pneumothorax. No displaced rib fracture. Prior cervical and lumbar fusion. Hardware intact. IMPRESSION: No evidence of displaced rib fracture or pneumothorax. Electronically Signed   By: Maisie Fushomas  Register   On: 10/13/2017 16:44    ____________________________________________   PROCEDURES  Procedure(s) performed:   Procedures  None ____________________________________________   INITIAL IMPRESSION / ASSESSMENT AND PLAN / ED COURSE  Pertinent labs & imaging results that were available during my care of the patient were reviewed by me and considered in my medical decision making (see chart for details).  Patient presents to the emergency department for evaluation after mechanical fall.  She has left lateral chest wall pain which is reproducible on palpation of the chest.  Extremely low suspicion for coronary artery disease or other vascular etiology for her symptoms.  Plan for chest x-ray to evaluate for fracture and rule out  pneumothorax.  Plain films are negative. Plan for RICE at home and PCP follow up.   At this time, I do not feel there is any life-threatening condition present. I have reviewed and discussed all results (EKG, imaging, lab, urine as appropriate), exam findings with patient. I have reviewed nursing notes and appropriate previous records.  I feel the patient is safe to be discharged home without further emergent workup. Discussed usual and customary return precautions. Patient and family (if present) verbalize understanding and are comfortable with this plan.  Patient will follow-up with their primary care provider. If they do not have a primary care provider, information for follow-up has been provided to them. All questions have been answered.  ____________________________________________  FINAL CLINICAL IMPRESSION(S) / ED DIAGNOSES  Final diagnoses:  Fall, initial encounter  Left-sided chest wall pain    Note:  This document was prepared using Dragon voice recognition software and may include unintentional dictation errors.  Alona BeneJoshua Long, MD Emergency Medicine    Long, Arlyss RepressJoshua G, MD 10/13/17 Mikle [id number]

## 2017-10-13 NOTE — Discharge Instructions (Signed)

## 2017-10-13 NOTE — ED Notes (Signed)
Patient transported to X-ray 

## 2017-10-13 NOTE — ED Notes (Signed)
ED Provider at bedside. 

## 2018-04-25 ENCOUNTER — Encounter (INDEPENDENT_AMBULATORY_CARE_PROVIDER_SITE_OTHER): Payer: Self-pay | Admitting: Orthopaedic Surgery

## 2018-04-25 ENCOUNTER — Ambulatory Visit (INDEPENDENT_AMBULATORY_CARE_PROVIDER_SITE_OTHER): Payer: Medicare HMO | Admitting: Orthopaedic Surgery

## 2018-04-25 VITALS — BP 140/81 | HR 78 | Ht 63.0 in | Wt 199.0 lb

## 2018-04-25 DIAGNOSIS — Z981 Arthrodesis status: Secondary | ICD-10-CM | POA: Diagnosis not present

## 2018-04-25 NOTE — Progress Notes (Signed)
Office Visit Note   Patient: Joanna Castro           Date of Birth: 1946/05/26           MRN: [id number] Visit Date: 04/25/2018              Requested by: Charlette CaffeyParuchuri, Lakshmi P, MD [address] [place], [place] [address] PCP: Charlette CaffeyParuchuri, Lakshmi P, MD   Assessment & Plan: Visit Diagnoses:  1. S/P lumbar fusion     Plan: Post L2-L5 fusion.  Mild changes that T12-L1, L4-5 without significant compression.  She will look for some pool therapy to work on weight loss try to gradually work on dieting and core strengthening and I will recheck her in 6 months.  I reviewed the images with her gave her a copy of the report at the current time no surgery is indicated.  Follow-Up Instructions: Return in about 6 months (around 10/25/2018).   Orders:  No orders of the defined types were placed in this encounter.  No orders of the defined types were placed in this encounter.     Procedures: No procedures performed   Clinical Data: No additional findings.   Subjective: Chief Complaint  Patient presents with  . Lower Back - Pain    HPI 72 year old female returns with ongoing problems with her back.  Her total knee arthroplasty on the right is doing well.  She had extension of her fusion in July 2018 at the L2-3 level after being fused from L3-L5 previously.  MRI scan has been obtained and is available for review.  Patient had trochanteric injection or intramuscular prednisone injection and prednisone pack x2 in the past that gave her improvement.  She had a lumpectomy with precancerous finding she is on raloxifene.  She has burning on the right buttocks that radiates to the trochanter occasionally radiates down to her foot.  MRI images are available for review today with the patient.  I gave her a copy of the report.  Review of Systems 14 point review of systems updated unchanged from June 03, 2017 office visit other than as mentioned in HPI.   Objective: Vital Signs: BP 140/81    Pulse 78   Ht 5\' 3"  (1.6 m)   Wt 199 lb (90.3 kg)   BMI 35.25 kg/m   Physical Exam  Constitutional: She is oriented to person, place, and time. She appears well-developed.  HENT:  Head: Normocephalic.  Right Ear: External ear normal.  Left Ear: External ear normal.  Eyes: Pupils are equal, round, and reactive to light.  Neck: No tracheal deviation present. No thyromegaly present.  Cardiovascular: Normal rate.  Pulmonary/Chest: Effort normal.  Abdominal: Soft.  Neurological: She is alert and oriented to person, place, and time.  Skin: Skin is warm and dry.  Psychiatric: She has a normal mood and affect. Her behavior is normal.    Ortho Exam patient has bilateral trochanteric tenderness.  Mild sciatic notch tenderness on the right negative straight leg raising 90 degrees.  Midline lumbar incision is well-healed without tenderness.  Anterior tib gastrocsoleus is intact she has lumbar curvature.  Sensory testing lower extremities is normal.  Negative Homan.  No calf tenderness.  Specialty Comments:  No specialty comments available.  Imaging: Lumbar MRI scan 03/29/2018 comparison to previous images February 2018 shows mild left foraminal impingement T12-L1 stable.  Borderline impingement L1 to and also L4-5.  Complex fluid collection not connected to the thecal sac consistent with chronic complex postoperative  fluid collection.  Bilateral renal cyst unchanged.  No evidence of arachnoiditis.  Satisfactory decompression of the previous surgical levels.   PMFS History: Patient Active Problem List   Diagnosis Date Noted  . S/P lumbar fusion 07/18/2017  . Postoperative urinary retention 06/03/2017  . Lumbar stenosis with neurogenic claudication 05/30/2017   Past Medical History:  Diagnosis Date  . Arthritis   . Fibromyalgia   . Glaucoma   . High cholesterol   . History of blood transfusion   . Hypertension   . Kidney cysts    effecting creat.  . Scoliosis   . Sleep apnea     intermittently uses cpap ( started 2 weeks ago)  . Spinal stenosis     No family history on file.  Past Surgical History:  Procedure Laterality Date  . ABDOMINAL HYSTERECTOMY    . ANKLE FRACTURE SURGERY    . BACK SURGERY    . CERVICAL SPINE SURGERY  06/2016   [facility]  . CHOLECYSTECTOMY    . EYE SURGERY Bilateral    placed duct tubes  . FINGER SURGERY    . KNEE ARTHROPLASTY Right 04/29/2014   Procedure: COMPUTER ASSISTED TOTAL KNEE ARTHROPLASTY;  Surgeon: Eldred Manges, MD;  Location: [facility] OR;  Service: Orthopedics;  Laterality: Right;  Cemented Right Total Knee Arthroplasty   Social History   Occupational History  . Not on file  Tobacco Use  . Smoking status: Never Smoker  . Smokeless tobacco: Never Used  Substance and Sexual Activity  . Alcohol use: Yes    Comment: rare  . Drug use: No  . Sexual activity: Not on file

## 2018-06-02 IMAGING — RF DG MYELOGRAM LUMBAR
9 series · 9 of 9 positions shown · non-contrast
Comparison: Preoperative MRI 01/01/2017

CLINICAL DATA: Incisional drainage. Recent lumbar spine fusion with
dural injury on the left at L2-3. No infectious symptoms. No
clinical signs of intracranial hypotension or other meningeal
irritation.
TECHNIQUE: Contiguous axial images were obtained through the Lumbar spine after
the intrathecal infusion of infusion. Coronal and sagittal
reconstructions were obtained of the axial image sets.

[Series 1: fluoro_myelogram_singleshot_bw · 0.17mm/px · 1 of 1 slices shown (1 of 4)]
[im 1/1]
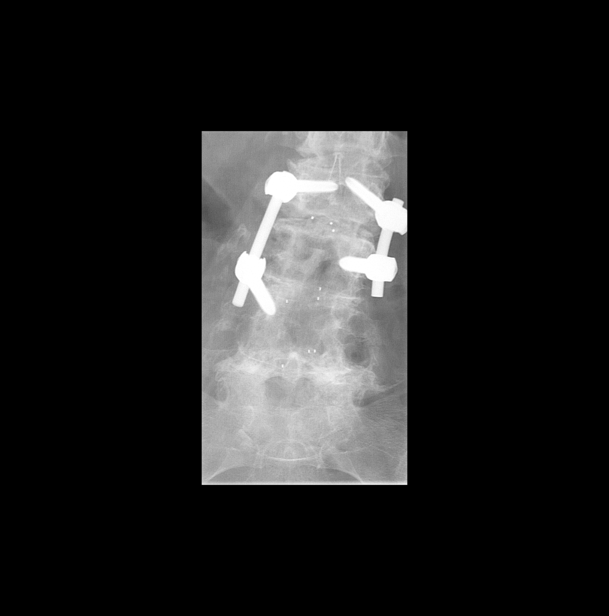

[Series 2: cp_standard · 0.25mm/px · 1 of 1 slices shown (1 of 5)]
[im 1/1]
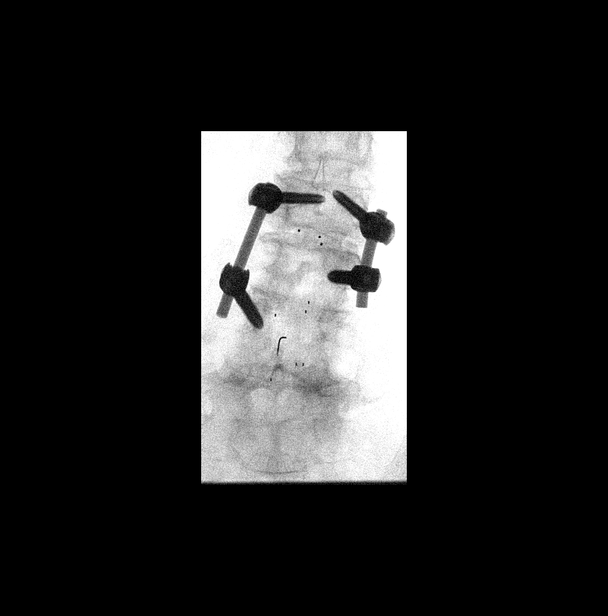

[Series 3: cp_standard · 0.25mm/px · 1 of 1 slices shown (2 of 5)]
[im 1/1]
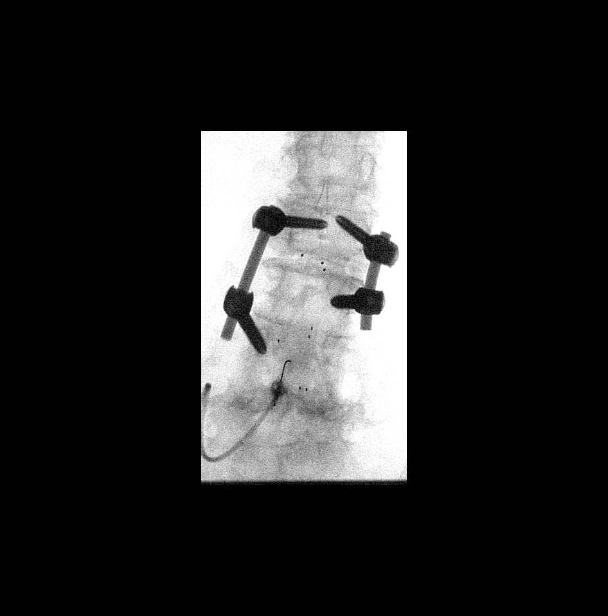

[Series 4: cp_standard · 0.25mm/px · 1 of 1 slices shown (3 of 5)]
[im 1/1]
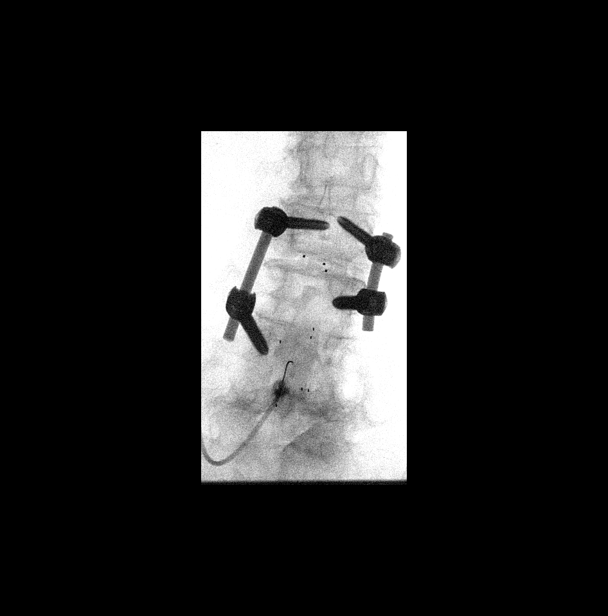

[Series 5: cp_standard · 0.25mm/px · 1 of 1 slices shown (4 of 5)]
[im 1/1]
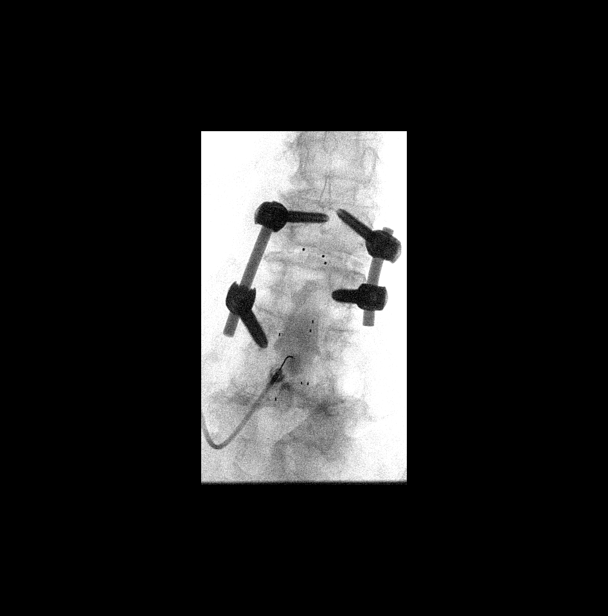

[Series 6: cp_standard · 0.25mm/px · 1 of 1 slices shown (5 of 5)]
[im 1/1]
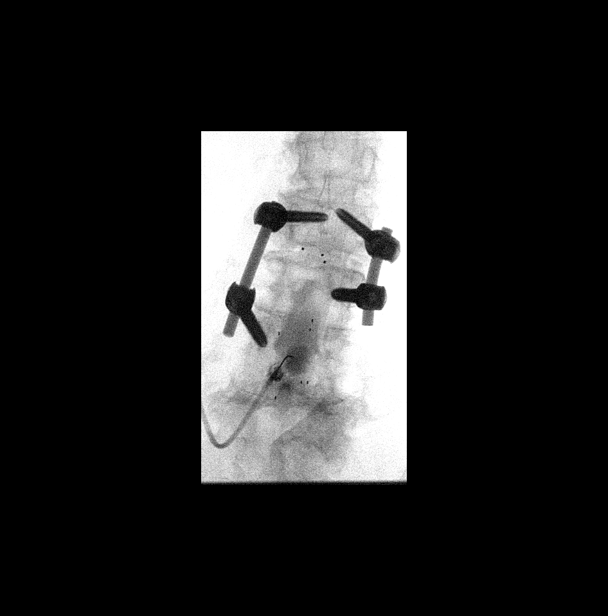

[Series 7: fluoro_myelogram_singleshot_bw · 0.17mm/px · 1 of 1 slices shown (2 of 4)]
[im 1/1]
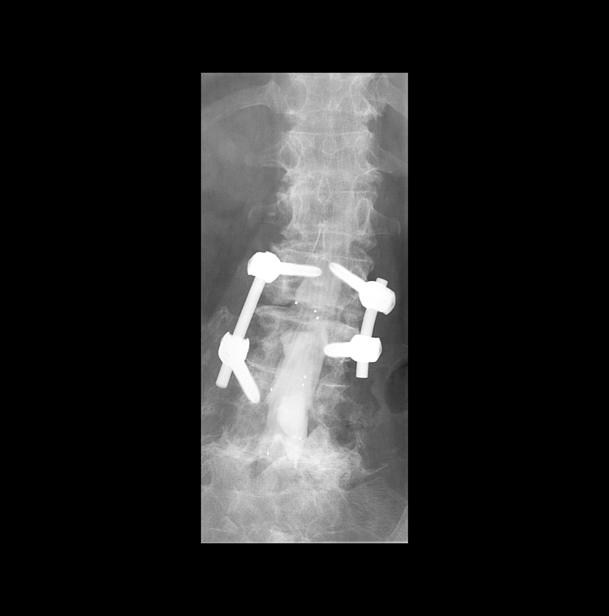

[Series 8: fluoro_myelogram_singleshot_bw · 0.17mm/px · 1 of 1 slices shown (3 of 4)]
[im 1/1]
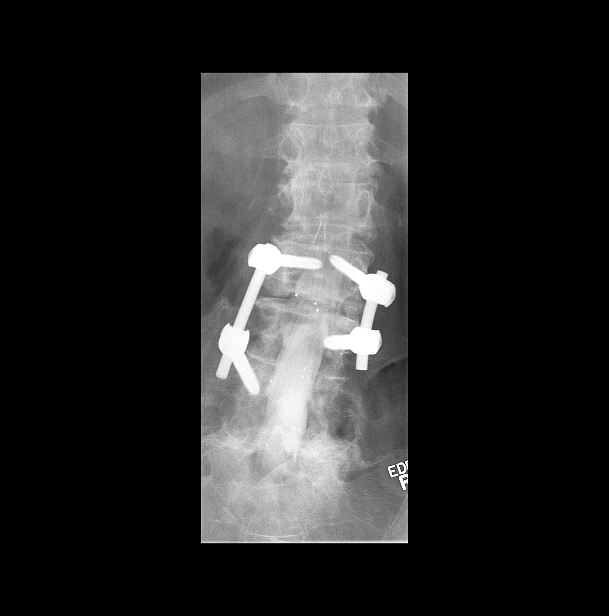

[Series 9: fluoro_myelogram_singleshot_bw · 0.18mm/px · 1 of 1 slices shown (4 of 4)]
[im 1/1]
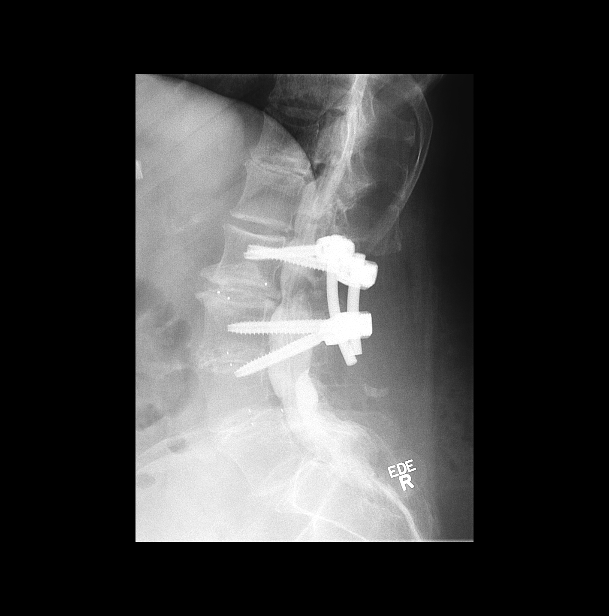

[9 of 9 positions shown; findings below may reference images not displayed]

EXAM:
LUMBAR MYELOGRAM

FLUOROSCOPY TIME:  2 minutes 24 seconds

PROCEDURE:
After thorough discussion of risks and benefits of the procedure
including bleeding, infection, injury to nerves, blood vessels,
adjacent structures as well as headache and CSF leak, written and
oral informed consent was obtained. Consent was obtained by Dr.
Cklass Lo Pez. Time out form was completed.

Patient was positioned prone on the fluoroscopy table. Local
anesthesia was provided with 1% lidocaine without epinephrine after
prepped and draped in the usual sterile fashion. The patient's lower
incision was opened and packed. There is no clinical signs of
infection on the skin surface. The iodoform guaze was prepped and
excluded from the field. The incision covered the entire accessible
spine. Puncture was performed at the L4 laminectomy defect, left
paramedian of the incision. Using a single pass through the dura,
the needle was placed within the thecal sac, with return of low
pressure CSF. Table tilt and Valsalva was required to create CSF
flow. 15 mL of Isovue 200 M was injected into the thecal sac, with
normal opacification of the nerve roots and cauda equina consistent
with free flow within the subarachnoid space. Towards the end of the
injection, a nonmobile density was seen at the tip of the needle,
consistent with subdural injection.

I personally performed the lumbar puncture and administered the
intrathecal contrast. I also personally supervised acquisition of
the myelogram images.
FINDINGS: LUMBAR MYELOGRAM FINDINGS:

No contrast leakage seen at the L2-3 operative level. Toward the end
of the injection, subdural contrast was seen accumulating at the
needle tip. Weight-bearing and flexion/extension views were not
obtained in this clinical setting.

CT LUMBAR MYELOGRAM FINDINGS:

Segmentation: There are 6 lumbar type vertebral bodies. On previous
chest radiographs there are 11 paired ribs. The twelfth vertebra is
considered non rib-bearing. This matches previous imaging and
operative reports.

Alignment: Dextroscoliosis.

Vertebrae: Remote L3-4 and L4-5 discectomy with solid arthrodesis.
Recent L2-3 discectomy. There are right-sided pedicle screws at L2
and L3 and left-sided pedicle screws at L2 and L4. A left L2 pedicle
fracture is noted, nondisplaced. The left L2 pedicle screw partially
traverses the subarticular recess. There is a subtle crescentic
epidural region high density seen just below the level is screw, but
not convincing for leak given this could instead be bone and a
combination of streak artifact.

Ventral right para median epidural high-density in the right L3/4
subarticular recess, favored to reflect bone. Neighboring contrast
is seen into the root sleeve cyst. The spinal needle was never in
this area to suggest procedural contrast. There is no operative
indication of leak risk in this area.

Given surgery timing, there is prominent fluid in the subfascial
incision. At the level of image 72 there is minimal internal
contrast, which is likely from the needle tip which had to traverse
this area based on size and puncture. No continuous epidural
contrast is seen. High-density in the lower subcutaneous incision is
attributed to the iodoform guaze. Gas around the cause correlates
with report of packing and probing to the level of the fascia.

Disc levels:

T12- L1: Advanced degenerative disc narrowing with endplate
irregularity and circumferential disc bulging. Moderate left
foraminal narrowing.

L1-L2: Degenerative disc narrowing and bulging with mild facet
spurring. Facet arthropathy cause mild left foraminal stenosis.
Patent spinal canal.

L2-L3: Postoperative changes described above. Improved patency of
the thecal sac after decompression. The intervertebral cage is
located intervertebral space. Left L2 pedicle fracture. No bony
foraminal stenosis.

L3-L4: Discectomy with solid arthrodesis. Presumed ridge along the
right subarticular recess with no residual impingement

L4-L5: Dorsal subdural collection which appears localized. This was
seen during injection and does not represent a leak. Discectomy with
solid arthrodesis and no residual impingement.

L5-S1:Asymmetric right disc narrowing and endplate spurring. Facet
arthropathy spurring greater on the right. Noncompressive bilateral
foraminal narrowing.

These results were called by telephone at the time of interpretation
on 06/10/2017 at [DATE] to Dr. ANDREJUS IR KRISTINA RITUTE , who verbally
acknowledged these results.
IMPRESSION: LUMBAR MYELOGRAM IMPRESSION:

Negative for epidural leakage. Small volume subdural injection seen
fluoroscopically.

CT LUMBAR MYELOGRAM IMPRESSION:

1. No definitive or brisk CSF leak. No contrast seen extending to
the leaking lower incision where there is subcutaneous packing.
2. Recent L2-3 discectomy. The left L2 pedicle encroaches on the
left subarticular recess. Just below this pedicle screw, the region
of recent dural repair, there is minimal epidural high density which
could contrast or bone. If ongoing wound leakage, a noncontrast CT
early next week may be able to differentiate.
3. Left L2 pedicle fracture.
4. Right subarticular recess high-density at L3-4 is likely
postoperative ossification. Attention to this area if a follow-up CT
is needed.
5. Subfascial incisional fluid and gas that is nonspecific.
6. Hypoplastic ribs at T12. Spinal numbering as previously
established.

## 2018-10-25 ENCOUNTER — Encounter (INDEPENDENT_AMBULATORY_CARE_PROVIDER_SITE_OTHER): Payer: Self-pay | Admitting: Orthopaedic Surgery

## 2018-10-25 ENCOUNTER — Ambulatory Visit (INDEPENDENT_AMBULATORY_CARE_PROVIDER_SITE_OTHER): Payer: Medicare HMO | Admitting: Orthopaedic Surgery

## 2018-10-25 VITALS — BP 110/61 | HR 71 | Ht 63.0 in | Wt 199.0 lb

## 2018-10-25 DIAGNOSIS — Z981 Arthrodesis status: Secondary | ICD-10-CM | POA: Diagnosis not present

## 2018-10-25 NOTE — Progress Notes (Signed)
Office Visit Note   Patient: Joanna Castro           Date of Birth: Mar 09, 1946           MRN: [id number] Visit Date: 10/25/2018              Requested by: Charlette CaffeyParuchuri, Lakshmi P, MD [address] [place], [place] [address] PCP: Charlette CaffeyParuchuri, Lakshmi P, MD   Assessment & Plan: Visit Diagnoses:  1. S/P lumbar fusion     Plan: She is doing well after her L2-3 fusion for adjacent level progression and spinal stenosis.  Surgery date was 05/30/2017.  I will check her back in on an as-needed basis.  Follow-Up Instructions: Return if symptoms worsen or fail to improve.   Orders:  No orders of the defined types were placed in this encounter.  No orders of the defined types were placed in this encounter.     Procedures: No procedures performed   Clinical Data: No additional findings.   Subjective: Chief Complaint  Patient presents with  . Lower Back - Follow-up    HPI 72 year old female seen for follow-up with ongoing problems with back pain post lumbar fusion.  She had L2-3 left TLIF for adjacent level disease.  She is using gabapentin and diclofenac.  We discussed she could talk with her PCP possibly switching her to Naprosyn which is a little bit lower cardiovascular and CVA risk versus diclofenac.  She states if she does some turning twisting type activities she has some increased discomfort.  She also has some ongoing problems with her neck and [facility].  She states the cervical procedure gave her good relief of her arm pain and numbness.  Review of Systems reviewed updated unchanged from 04/25/2018 office visit.   Objective: Vital Signs: BP 110/61   Pulse 71   Ht 5\' 3"  (1.6 m)   Wt 199 lb (90.3 kg)   BMI 35.25 kg/m   Physical Exam Constitutional:      Appearance: She is well-developed.  HENT:     Head: Normocephalic.     Right Ear: External ear normal.     Left Ear: External ear normal.  Eyes:       Pupils: Pupils are equal, round, and reactive to light.  Neck:     Thyroid: No thyromegaly.     Trachea: No tracheal deviation.  Cardiovascular:     Rate and Rhythm: Normal rate.  Pulmonary:     Effort: Pulmonary effort is normal.  Abdominal:     Palpations: Abdomen is soft.  Skin:    General: Skin is warm and dry.  Neurological:     Mental Status: She is alert and oriented to person, place, and time.  Psychiatric:        Behavior: Behavior normal.     Ortho Exam negative Trendelenburg gait.  Good strength hip flexors quads are strong.  She is amatory without limp no pitting edema lumbar incisions well-healed.  Specialty Comments:  No specialty comments available.  Imaging: No results found.   PMFS History: Patient Active Problem List   Diagnosis Date Noted  . S/P lumbar fusion 07/18/2017  . Postoperative urinary retention 06/03/2017  . Lumbar stenosis with neurogenic claudication 05/30/2017   Past Medical History:  Diagnosis Date  . Arthritis   . Fibromyalgia   . Glaucoma   . High cholesterol   . History of blood transfusion   . Hypertension   .  Kidney cysts    effecting creat.  . Scoliosis   . Sleep apnea    intermittently uses cpap ( started 2 weeks ago)  . Spinal stenosis     History reviewed. No pertinent family history.  Past Surgical History:  Procedure Laterality Date  . ABDOMINAL HYSTERECTOMY    . ANKLE FRACTURE SURGERY    . BACK SURGERY    . CERVICAL SPINE SURGERY  06/2016   [facility]  . CHOLECYSTECTOMY    . EYE SURGERY Bilateral    placed duct tubes  . FINGER SURGERY    . KNEE ARTHROPLASTY Right 04/29/2014   Procedure: COMPUTER ASSISTED TOTAL KNEE ARTHROPLASTY;  Surgeon: Eldred Manges, MD;  Location: [facility] OR;  Service: Orthopedics;  Laterality: Right;  Cemented Right Total Knee Arthroplasty   Social History   Occupational History  . Not on file  Tobacco Use  . Smoking status: Never Smoker  . Smokeless tobacco: Never Used   Substance and Sexual Activity  . Alcohol use: Yes    Comment: rare  . Drug use: No  . Sexual activity: Not on file

## 2019-01-27 IMAGING — CR DG CHEST 2V
2 series · 2 of 2 positions shown · non-contrast
Comparison: 04/22/2014

CLINICAL DATA: Preoperative assessment for lumbar spine surgery,
history hypertension

EXAM:
CHEST  2 VIEW

[w chest pa]
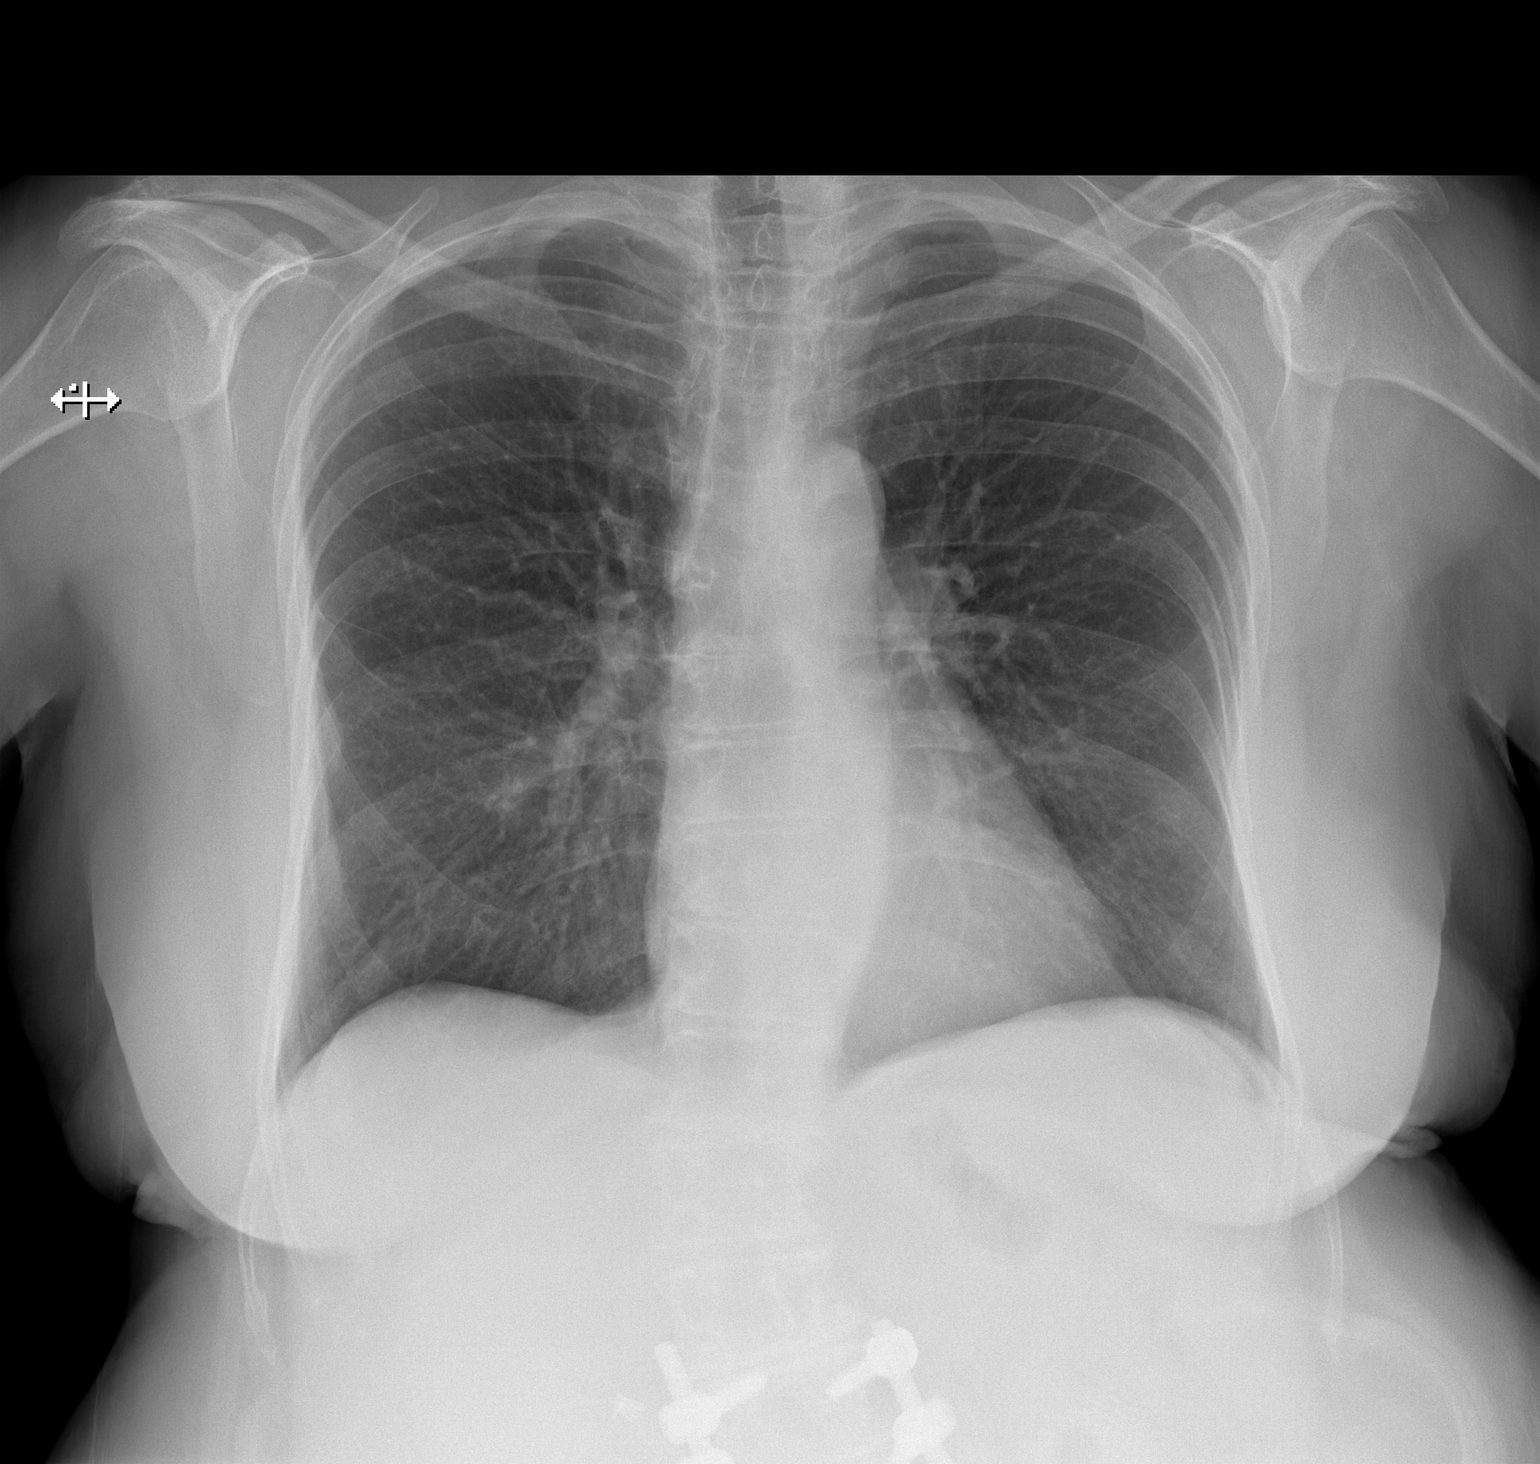

[w chest lat]
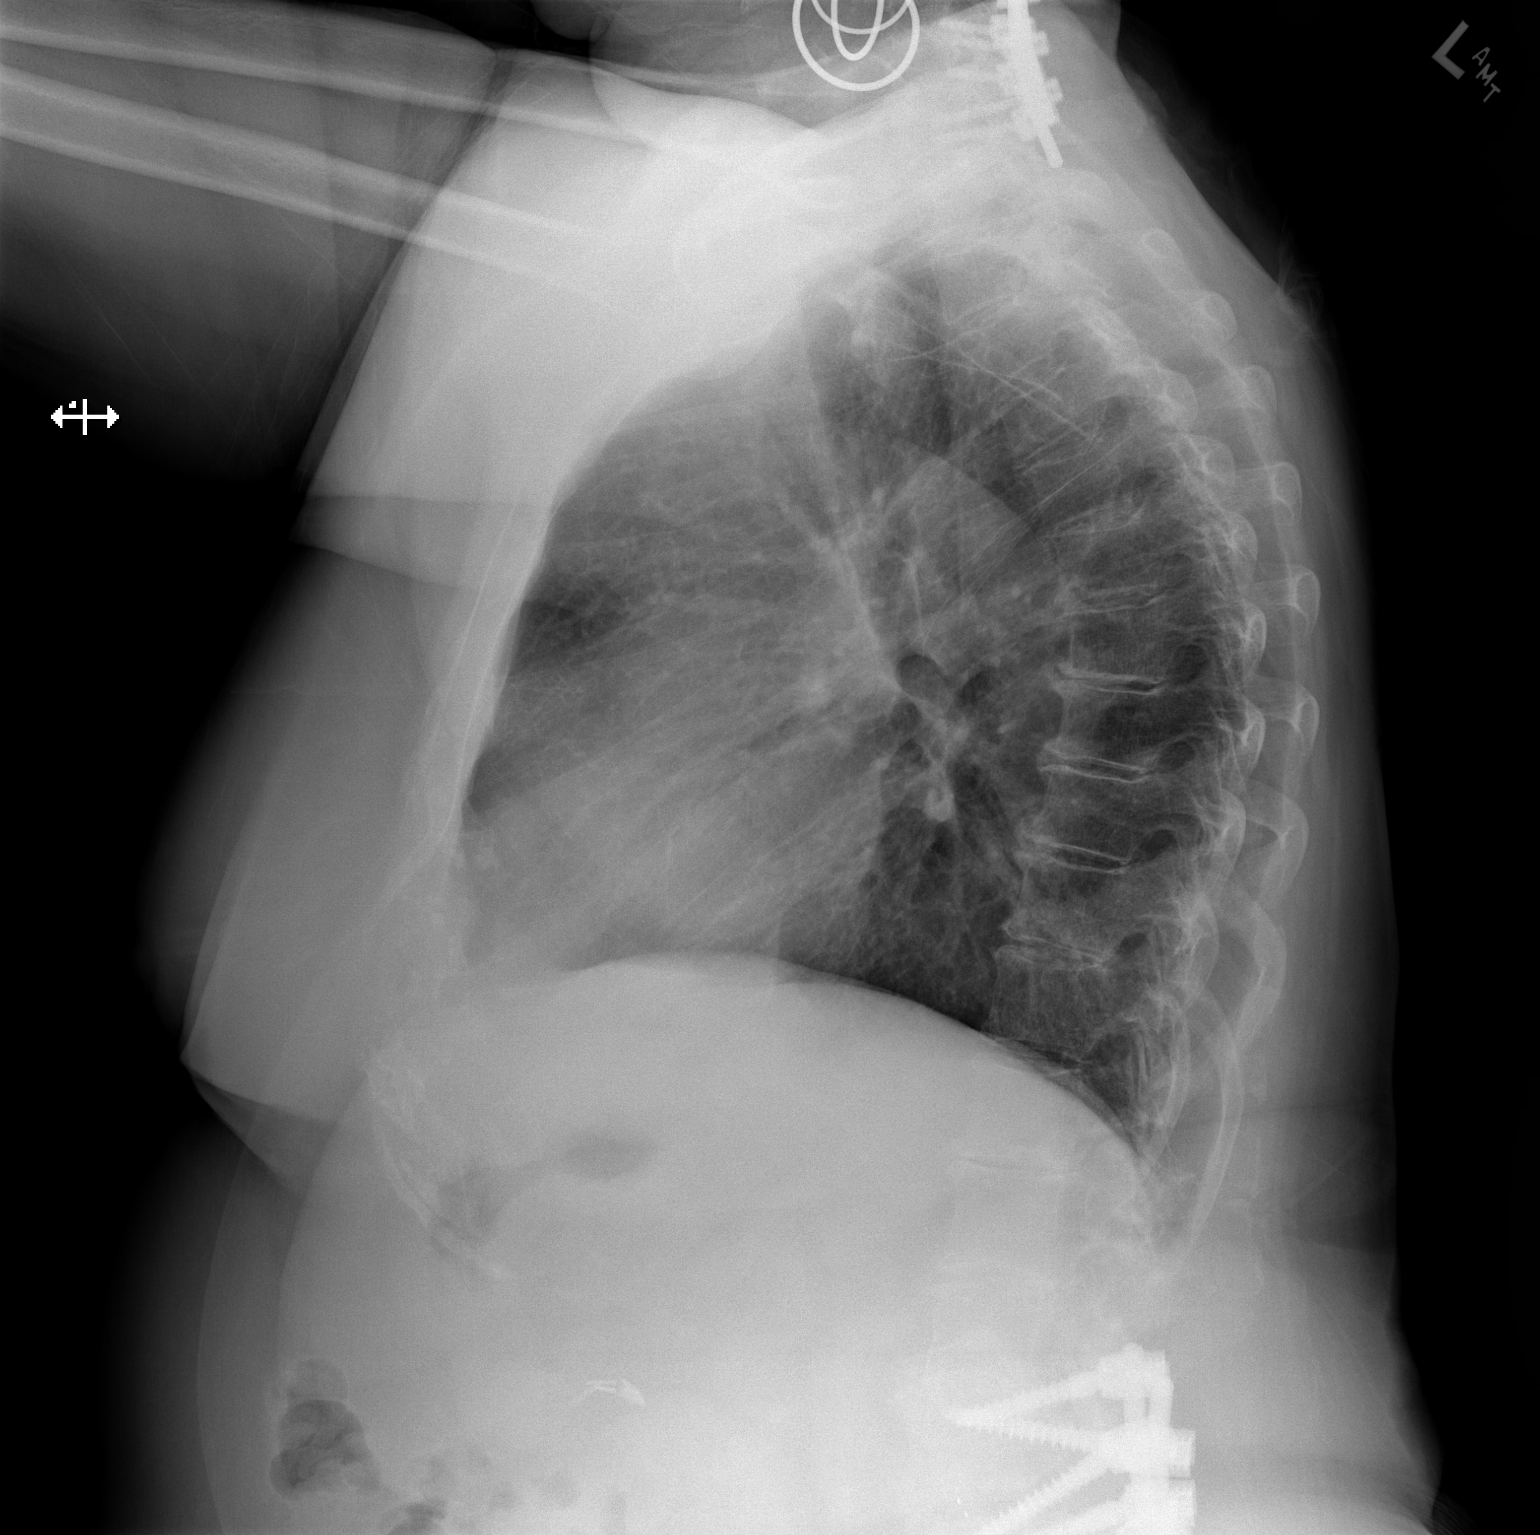

[2 of 2 positions shown; findings below may reference images not displayed]

FINDINGS: Normal heart size, mediastinal contours, and pulmonary vascularity.

Lungs clear.

No pleural effusion or pneumothorax.

Prior cervical and lumbar fusions.

Bones appear demineralized.
IMPRESSION: No acute abnormalities.

## 2019-02-05 ENCOUNTER — Telehealth (INDEPENDENT_AMBULATORY_CARE_PROVIDER_SITE_OTHER): Payer: Self-pay | Admitting: *Deleted

## 2019-02-05 NOTE — Telephone Encounter (Signed)
Prescreened pt for COVID 19 for appt scheduled 02/06/2019 and pt answered NO to all questions 

## 2019-02-06 ENCOUNTER — Other Ambulatory Visit: Payer: Self-pay

## 2019-02-06 ENCOUNTER — Ambulatory Visit (INDEPENDENT_AMBULATORY_CARE_PROVIDER_SITE_OTHER): Payer: Medicare HMO | Admitting: Orthopaedic Surgery

## 2019-02-06 ENCOUNTER — Encounter (INDEPENDENT_AMBULATORY_CARE_PROVIDER_SITE_OTHER): Payer: Self-pay | Admitting: Orthopaedic Surgery

## 2019-02-06 VITALS — Ht 65.0 in | Wt 204.0 lb

## 2019-02-06 DIAGNOSIS — Z981 Arthrodesis status: Secondary | ICD-10-CM

## 2019-02-06 NOTE — Progress Notes (Signed)
Office Visit Note   Patient: Joanna Castro           Date of Birth: June 20, 1946           MRN: [id number] Visit Date: 02/06/2019              Requested by: Charlette Caffey, MD [address] [place], [place] [address] PCP: Charlette Caffey, MD   Assessment & Plan: Visit Diagnoses:  1. S/P lumbar fusion     Plan: She will continue with a walking program.  We discussed considering a single injection above her fusion if her symptoms do not settle down.  She has good hip flexion strength strength and good quad strength.  Recheck 1 month.  Follow-Up Instructions: Return in about 1 month (around 03/08/2019).   Orders:  No orders of the defined types were placed in this encounter.  No orders of the defined types were placed in this encounter.     Procedures: No procedures performed   Clinical Data: No additional findings.   Subjective: Chief Complaint  Patient presents with  . Lower Back - Pain    HPI 73 year old female returns was for by Dr. Louis Meckel after x-rays demonstrated progression of spondylosis with endplate spurring and narrowing at T12-L1.  She has had previous fusion done at L2-3 on the left with dural repair on the left.  She is primarily having right leg symptoms burning symptoms and is on Neurontin 900 mg at night.  She is on prednisone for a few weeks took states she felt much better had much less pain.  She brought with her today a disc which shows some evidence of screw loosening or a sclerotic line around the inferior screw on both right and left side.X-rays done at [facility] in [facility] showed screws at L2-3 on the right and L2 and L4 on the left.  She has transitional anatomy.  No bowel bladder symptoms she is ambuatory with a cane.  There was progression of degenerative changes at T12-L1.  Endplate sclerosis osteophyte formation with disc space narrowing. Review of Systems updated and unchanged from last office visit.  Objective:  Vital Signs: Ht 5\' 5"  (1.651 m)   Wt 204 lb (92.5 kg)   BMI 33.95 kg/m   Physical Exam Constitutional:      Appearance: She is well-developed.  HENT:     Head: Normocephalic.     Right Ear: External ear normal.     Left Ear: External ear normal.  Eyes:     Pupils: Pupils are equal, round, and reactive to light.  Neck:     Thyroid: No thyromegaly.     Trachea: No tracheal deviation.  Cardiovascular:     Rate and Rhythm: Normal rate.  Pulmonary:     Effort: Pulmonary effort is normal.  Abdominal:     Palpations: Abdomen is soft.  Skin:    General: Skin is warm and dry.  Neurological:     Mental Status: She is alert and oriented to person, place, and time.  Psychiatric:        Behavior: Behavior normal.     Ortho Exam lumbar incision well-healed.  Good hip range of motion.  Lumbar incisions well-healed distal pulses are intact.  Specialty Comments:  No specialty comments available.  Imaging: No results found.   PMFS History: Patient Active Problem List   Diagnosis Date Noted  . S/P lumbar fusion 07/18/2017  . Postoperative urinary retention 06/03/2017  . Lumbar  stenosis with neurogenic claudication 05/30/2017   Past Medical History:  Diagnosis Date  . Arthritis   . Fibromyalgia   . Glaucoma   . High cholesterol   . History of blood transfusion   . Hypertension   . Kidney cysts    effecting creat.  . Scoliosis   . Sleep apnea    intermittently uses cpap ( started 2 weeks ago)  . Spinal stenosis     No family history on file.  Past Surgical History:  Procedure Laterality Date  . ABDOMINAL HYSTERECTOMY    . ANKLE FRACTURE SURGERY    . BACK SURGERY    . CERVICAL SPINE SURGERY  06/2016   [facility]  . CHOLECYSTECTOMY    . EYE SURGERY Bilateral    placed duct tubes  . FINGER SURGERY    . KNEE ARTHROPLASTY Right 04/29/2014   Procedure: COMPUTER ASSISTED TOTAL KNEE ARTHROPLASTY;  Surgeon: Eldred Manges, MD;  Location: [facility] OR;  Service: Orthopedics;   Laterality: Right;  Cemented Right Total Knee Arthroplasty   Social History   Occupational History  . Not on file  Tobacco Use  . Smoking status: Never Smoker  . Smokeless tobacco: Never Used  Substance and Sexual Activity  . Alcohol use: Yes    Comment: rare  . Drug use: No  . Sexual activity: Not on file

## 2019-03-07 ENCOUNTER — Other Ambulatory Visit: Payer: Self-pay

## 2019-03-07 ENCOUNTER — Ambulatory Visit (INDEPENDENT_AMBULATORY_CARE_PROVIDER_SITE_OTHER): Payer: Medicare HMO | Admitting: Orthopaedic Surgery

## 2019-03-07 ENCOUNTER — Encounter (INDEPENDENT_AMBULATORY_CARE_PROVIDER_SITE_OTHER): Payer: Self-pay | Admitting: Orthopaedic Surgery

## 2019-03-07 VITALS — Ht 65.0 in | Wt 204.0 lb

## 2019-03-07 DIAGNOSIS — Z981 Arthrodesis status: Secondary | ICD-10-CM

## 2019-03-07 NOTE — Progress Notes (Signed)
Office Visit Note   Patient: Joanna Castro           Date of Birth: [birth year]           MRN: [id number] Visit Date: 03/07/2019              Requested by: Joanna Castro, Joanna P, MD [address] [place], [place] [id number] PCP: Joanna Castro, Joanna P, MD   Assessment & Plan: Visit Diagnoses:  1. S/P lumbar fusion     Plan: 2 years post L2-3 fusion above previous solid fusion.  L5-S1 level is open.  No stenosis at that level.  Previous MRI scan is reviewed.  Ecchymosis is resolving.  The pain she was having a few months ago is improved.  Should continue walking program.  No claudication symptoms at this time.  X-rays of her lumbar spine were deferred at this time she will return if she has increased problems.  Follow-Up Instructions: Return if symptoms worsen or fail to improve.   Orders:  No orders of the defined types were placed in this encounter.  No orders of the defined types were placed in this encounter.     Procedures: No procedures performed   Clinical Data: No additional findings.   Subjective: Chief Complaint  Patient presents with  . Lower Back - Pain, Follow-up    HPI 73 year old female returns post L2-3T lift fusion with dural repair 05/30/2017.  She been having problems with back pain and we had considered a possible injection above her fusion where she had some mild narrowing but no severe stenosis.  She had a fall on her right side 2 weeks ago with a bruise on her right side and states her back actually is gotten better and the burning pain has resolved since her fall.  Previous outside images brought in by her on a disc at last visit showed some evidence of loosening of screws but no migration of the cage and no subsidence of the cages.  She has been amatory with a cane and states she has been sore primarily over her right buttocks right lateral thigh where she has bruising but states is slowly been getting better over the last week since the fall 2 weeks  ago.  No bowel or bladder symptoms.  No numbness or tingling in her legs.  She continues to use some Neurontin.  She did use some tramadol after the acute fall.  Review of Systems previous multilevel lumbar fusion.  Previous cervical fusion anterior and posteriorly.  Posterior procedure done at [facility].  History of postop urinary retention.  Otherwise negative is obtains HPI.   Objective: Vital Signs: Ht 5\' 5"  (1.651 m)   Wt 204 lb (92.5 kg)   BMI 33.95 kg/m   Physical Exam Constitutional:      Appearance: She is well-developed.  HENT:     Head: Normocephalic.     Right Ear: External ear normal.     Left Ear: External ear normal.  Eyes:     Pupils: Pupils are equal, round, and reactive to light.  Neck:     Thyroid: No thyromegaly.     Trachea: No tracheal deviation.  Cardiovascular:     Rate and Rhythm: Normal rate.  Pulmonary:     Effort: Pulmonary effort is normal.  Abdominal:     Palpations: Abdomen is soft.  Skin:    General: Skin is warm and dry.  Neurological:     Mental Status: She is alert and oriented to  person, place, and time.  Psychiatric:        Behavior: Behavior normal.     Ortho Exam well-healed lumbar incision.  No erythema no drainage no tenderness.  Bruising is present over the right lateral hip right posterior lateral thigh which is resolving mostly yellow in color.  No thigh hematoma quads are active good abduction strength no pain with hip range of motion knees reach full extension.  Specialty Comments:  No specialty comments available.  Imaging: No results found.   PMFS History: Patient Active Problem List   Diagnosis Date Noted  . S/P lumbar fusion 07/18/2017  . Postoperative urinary retention 06/03/2017  . Lumbar stenosis with neurogenic claudication 05/30/2017   Past Medical History:  Diagnosis Date  . Arthritis   . Fibromyalgia   . Glaucoma   . High cholesterol   . History of blood transfusion   . Hypertension   . Kidney cysts     effecting creat.  . Scoliosis   . Sleep apnea    intermittently uses cpap ( started 2 weeks ago)  . Spinal stenosis     No family history on file.  Past Surgical History:  Procedure Laterality Date  . ABDOMINAL HYSTERECTOMY    . ANKLE FRACTURE SURGERY    . BACK SURGERY    . CERVICAL SPINE SURGERY  06/2016   [facility]  . CHOLECYSTECTOMY    . EYE SURGERY Bilateral    placed duct tubes  . FINGER SURGERY    . KNEE ARTHROPLASTY Right 04/29/2014   Procedure: COMPUTER ASSISTED TOTAL KNEE ARTHROPLASTY;  Surgeon: Joanna Manges, MD;  Location: [facility] OR;  Service: Orthopedics;  Laterality: Right;  Cemented Right Total Knee Arthroplasty   Social History   Occupational History  . Not on file  Tobacco Use  . Smoking status: Never Smoker  . Smokeless tobacco: Never Used  Substance and Sexual Activity  . Alcohol use: Yes    Comment: rare  . Drug use: No  . Sexual activity: Not on file

## 2019-11-09 HISTORY — PX: BREAST LUMPECTOMY: SHX2

## 2023-03-04 ENCOUNTER — Ambulatory Visit: Payer: Medicare HMO | Admitting: Orthopaedic Surgery

## 2023-03-04 ENCOUNTER — Encounter: Payer: Self-pay | Admitting: Orthopaedic Surgery

## 2023-03-04 ENCOUNTER — Other Ambulatory Visit (INDEPENDENT_AMBULATORY_CARE_PROVIDER_SITE_OTHER): Payer: Medicare HMO

## 2023-03-04 VITALS — BP 123/74 | HR 87 | Ht 65.0 in | Wt 200.0 lb

## 2023-03-04 DIAGNOSIS — G8929 Other chronic pain: Secondary | ICD-10-CM

## 2023-03-04 DIAGNOSIS — M25562 Pain in left knee: Secondary | ICD-10-CM | POA: Diagnosis not present

## 2023-03-04 DIAGNOSIS — M1712 Unilateral primary osteoarthritis, left knee: Secondary | ICD-10-CM

## 2023-03-04 NOTE — Progress Notes (Signed)
Office Visit Note   Patient: Joanna Castro           Date of Birth: 1946/08/26           MRN: [id number] Visit Date: 03/04/2023              Requested by: Charlette Caffey, MD [address] [place],  [place] [address] PCP: Charlette Caffey, MD   Assessment & Plan: Visit Diagnoses:  1. Chronic pain of left knee   2. Unilateral primary osteoarthritis, left knee     Plan: Patient like to proceed with left total knee arthroplasty.  We discussed home health.  She states that she will have care available for her postop.  We discussed outpatient therapy after home therapy for a few weeks.  Preoperative block, spinal anesthesia, Exparel, Marcaine importance of compliance with therapy all discussed in detail.  We reviewed MRI scan and discussed pathophysiology.  She has done well with her hip replacements and understands that she will have to work hard in therapy to get a good result.  Decision for surgery made today by the patient.  Follow-Up Instructions: No follow-ups on file.   Orders:  Orders Placed This Encounter  Procedures   XR KNEE 3 VIEW LEFT   No orders of the defined types were placed in this encounter.     Procedures: No procedures performed   Clinical Data: No additional findings.   Subjective: Chief Complaint  Patient presents with   Left Knee - Pain    HPI 77 year old female with ongoing problems with left knee pain.  Pain keeps her from falling asleep at night sometimes she can hardly walk.  She has had some falls in 2022 1 time she landed with her knees on cement.  She uses her walker at home ambulates with her cane at times and has her cane today.  She has to stand for a minute or 2 before she can begin to walk since the knee does not want to bend.  Previous L2-3 fusion 22,018.  Total knee arthroplasty and a right knee 2015 doing well.  She also had a posterior cervical fusion.  Review of Systems previous lumbar decompression fusion with postop  urinary retention resolved.  Previous right total knee arthroplasty.  Left knee osteoarthritis with falls.  Positive glasses.  No bleeding or anesthetic problems.   Objective: Vital Signs: BP 123/74   Pulse 87   Ht 5\' 5"  (1.651 m)   Wt 200 lb (90.7 kg)   BMI 33.28 kg/m   Physical Exam Constitutional:      Appearance: She is well-developed.  HENT:     Head: Normocephalic.     Right Ear: External ear normal.     Left Ear: External ear normal. There is no impacted cerumen.  Eyes:     Pupils: Pupils are equal, round, and reactive to light.  Neck:     Thyroid: No thyromegaly.     Trachea: No tracheal deviation.  Cardiovascular:     Rate and Rhythm: Normal rate.  Pulmonary:     Effort: Pulmonary effort is normal.  Abdominal:     Palpations: Abdomen is soft.  Musculoskeletal:     Cervical back: No rigidity.  Skin:    General: Skin is warm and dry.  Neurological:     Mental Status: She is alert and oriented to person, place, and time.  Psychiatric:        Behavior: Behavior normal.  Ortho ExamLeft knee crepitus with 10 to 100 degrees range of motion.  Medial lateral joint line tenderness palpable osteophytes.  Negative logroll hips.  Distal pulses are intact.  Specialty Comments:  No specialty comments available.  Imaging: Standing AP both knees lateral left knee obtained and reviewed.  This shows satisfactory right total knee without loosening or signs.  Left knee shows no joint space medial and lateral slight valgus patellofemoral degenerative changes subchondral sclerosis minimal subchondral cyst formation and small osteophytes.  Impression: Moderate to severe left knee osteoarthritis now bone-on-bone.   PMFS History: Patient Active Problem List   Diagnosis Date Noted   Unilateral primary osteoarthritis, left knee 03/12/2023   S/P lumbar fusion 07/18/2017   Postoperative urinary retention 06/03/2017   Lumbar stenosis with neurogenic claudication 05/30/2017    Past Medical History:  Diagnosis Date   Arthritis    Fibromyalgia    Glaucoma    High cholesterol    History of blood transfusion    Hypertension    Kidney cysts    effecting creat.   Scoliosis    Sleep apnea    intermittently uses cpap ( started 2 weeks ago)   Spinal stenosis     No family history on file.  Past Surgical History:  Procedure Laterality Date   ABDOMINAL HYSTERECTOMY     ANKLE FRACTURE SURGERY     BACK SURGERY     CERVICAL SPINE SURGERY  06/2016   [facility]   CHOLECYSTECTOMY     EYE SURGERY Bilateral    placed duct tubes   FINGER SURGERY     KNEE ARTHROPLASTY Right 04/29/2014   Procedure: COMPUTER ASSISTED TOTAL KNEE ARTHROPLASTY;  Surgeon: Eldred Manges, MD;  Location: [facility] OR;  Service: Orthopedics;  Laterality: Right;  Cemented Right Total Knee Arthroplasty   Social History   Occupational History   Not on file  Tobacco Use   Smoking status: Never   Smokeless tobacco: Never  Substance and Sexual Activity   Alcohol use: Yes    Comment: rare   Drug use: No   Sexual activity: Not on file

## 2023-03-12 DIAGNOSIS — M1712 Unilateral primary osteoarthritis, left knee: Secondary | ICD-10-CM | POA: Insufficient documentation

## 2023-04-06 ENCOUNTER — Other Ambulatory Visit: Payer: Self-pay | Admitting: Physician Assistant

## 2023-04-14 ENCOUNTER — Ambulatory Visit (INDEPENDENT_AMBULATORY_CARE_PROVIDER_SITE_OTHER): Payer: Medicare HMO | Admitting: Physician Assistant

## 2023-04-14 DIAGNOSIS — M1712 Unilateral primary osteoarthritis, left knee: Secondary | ICD-10-CM

## 2023-04-14 NOTE — H&P (Signed)
TOTAL KNEE ADMISSION H&P  Patient is being admitted for left total knee arthroplasty.  Subjective:  Chief Complaint:left knee pain.  HPI: Joanna Castro, 77 y.o. female, has a history of pain and functional disability in the left knee due to arthritis and has failed non-surgical conservative treatments for greater than 12 weeks to includeNSAID's and/or analgesics, corticosteriod injections, and activity modification.  Onset of symptoms was gradual, starting 5 years ago with gradually worsening course since that time. The patient noted no past surgery on the left knee(s).  Patient currently rates pain in the left knee(s) at 5 out of 10 with activity. Patient has worsening of pain with activity and weight bearing and pain that interferes with activities of daily living.  Patient has evidence of subchondral cysts and joint space narrowing by imaging studies. This patient has had  There is no active infection.  Patient Active Problem List   Diagnosis Date Noted   Unilateral primary osteoarthritis, left knee 03/12/2023   S/P lumbar fusion 07/18/2017   Postoperative urinary retention 06/03/2017   Lumbar stenosis with neurogenic claudication 05/30/2017   Past Medical History:  Diagnosis Date   Arthritis    Fibromyalgia    Glaucoma    High cholesterol    History of blood transfusion    Hypertension    Kidney cysts    effecting creat.   Scoliosis    Sleep apnea    intermittently uses cpap ( started 2 weeks ago)   Spinal stenosis     Past Surgical History:  Procedure Laterality Date   ABDOMINAL HYSTERECTOMY     ANKLE FRACTURE SURGERY     BACK SURGERY     CERVICAL SPINE SURGERY  06/2016   [facility]   CHOLECYSTECTOMY     EYE SURGERY Bilateral    placed duct tubes   FINGER SURGERY     KNEE ARTHROPLASTY Right 04/29/2014   Procedure: COMPUTER ASSISTED TOTAL KNEE ARTHROPLASTY;  Surgeon: Eldred Manges, MD;  Location: [facility] OR;  Service: Orthopedics;  Laterality: Right;  Cemented Right  Total Knee Arthroplasty    Current Outpatient Medications  Medication Sig Dispense Refill Last Dose   acetaminophen (TYLENOL) 500 MG tablet Take 1,000 mg by mouth in the morning and at bedtime.      allopurinol (ZYLOPRIM) 100 MG tablet Take 100 mg by mouth 2 (two) times daily.      amLODipine (NORVASC) 10 MG tablet Take 10 mg by mouth at bedtime.      CALCIUM CITRATE-VITAMIN D PO Take 2 tablets by mouth daily.      cetirizine (ZYRTEC) 10 MG tablet Take 10 mg by mouth daily.      Cholecalciferol (VITAMIN D3) 1000 units CAPS Take 1,000 Units by mouth daily.      diclofenac (CATAFLAM) 50 MG tablet Take 50 mg by mouth daily.      furosemide (LASIX) 20 MG tablet Take 20 mg by mouth daily.      gabapentin (NEURONTIN) 300 MG capsule Take 600 mg by mouth 2 (two) times daily.      MAGNESIUM PO Take 400 mg by mouth daily.      Multiple Vitamins-Minerals (MULTIVITAMIN WITH MINERALS) tablet Take 1 tablet by mouth daily.      potassium chloride (KLOR-CON M) 10 MEQ tablet Take 10 mEq by mouth daily.      pravastatin (PRAVACHOL) 40 MG tablet Take 40 mg by mouth at bedtime.      raloxifene (EVISTA) 60 MG tablet Take 60 mg  by mouth daily.      timolol (TIMOPTIC) 0.5 % ophthalmic solution Place 1 drop into both eyes 2 (two) times daily.       traMADol (ULTRAM) 50 MG tablet Take 100 mg by mouth every 6 (six) hours as needed for moderate pain.      No current facility-administered medications for this visit.   Allergies  Allergen Reactions   Chlorhexidine Hives and Rash    Social History   Tobacco Use   Smoking status: Never   Smokeless tobacco: Never  Substance Use Topics   Alcohol use: Yes    Comment: rare    No family history on file.   Review of Systems  All other systems reviewed and are negative.   Objective:  Physical Exam  Physical Exam Constitutional:      Appearance: She is well-developed.  HENT:     Head: Normocephalic.     Right Ear: External ear normal.     Left Ear:  External ear normal. There is no impacted cerumen.  Eyes:     Pupils: Pupils are equal, round, and reactive to light.  Neck:     Thyroid: No thyromegaly.     Trachea: No tracheal deviation.  Cardiovascular:     Rate and Rhythm: Normal rate.  Pulmonary:     Effort: Pulmonary effort is normal.  Abdominal:     Palpations: Abdomen is soft.  Musculoskeletal:     Cervical back: No rigidity.  Skin:    General: Skin is warm and dry.  Neurological:     Mental Status: She is alert and oriented to person, place, and time.  Psychiatric:        Behavior: Behavior normal.  Ortho ExamLeft knee crepitus with 10 to 100 degrees range of motion.  Medial lateral joint line tenderness palpable osteophytes.  Negative logroll hips.  Distal pulses are intact.  Vital signs in last 24 hours: @VSRANGES @  Labs:   Estimated body mass index is 33.28 kg/m as calculated from the following:   Height as of 03/04/23: 5\' 5"  (1.651 m).   Weight as of 03/04/23: 200 lb (90.7 kg).   Imaging Review Plain radiographs demonstrate moderate degenerative joint disease of the left knee(s). The overall alignment isneutral. The bone quality appears to be good for age and reported activity level.      Assessment/Plan:  End stage arthritis, left knee   The patient history, physical examination, clinical judgment of the provider and imaging studies are consistent with end stage degenerative joint disease of the left knee(s) and total knee arthroplasty is deemed medically necessary. The treatment options including medical management, injection therapy arthroscopy and arthroplasty were discussed at length. The risks and benefits of total knee arthroplasty were presented and reviewed. The risks due to aseptic loosening, infection, stiffness, patella tracking problems, thromboembolic complications and other imponderables were discussed. The patient acknowledged the explanation, agreed to proceed with the plan and consent was  signed. Patient is being admitted for inpatient treatment for surgery, pain control, PT, OT, prophylactic antibiotics, VTE prophylaxis, progressive ambulation and ADL's and discharge planning. The patient is planning to be discharged home with home health services     Patient's anticipated LOS is less than 2 midnights, meeting these requirements: - Younger than 84 - Lives within 1 hour of care - Has a competent adult at home to recover with post-op recover - NO history of  - Chronic pain requiring opiods  - Diabetes  - Coronary Artery Disease  -  Heart failure  - Heart attack  - Stroke  - DVT/VTE  - Cardiac arrhythmia  - Respiratory Failure/COPD  - Renal failure  - Anemia  - Advanced Liver disease

## 2023-04-14 NOTE — H&P (View-Only) (Signed)
TOTAL KNEE ADMISSION H&P  Patient is being admitted for left total knee arthroplasty.  Subjective:  Chief Complaint:left knee pain.  HPI: Joanna Castro, 76 y.o. female, has a history of pain and functional disability in the left knee due to arthritis and has failed non-surgical conservative treatments for greater than 12 weeks to includeNSAID's and/or analgesics, corticosteriod injections, and activity modification.  Onset of symptoms was gradual, starting 5 years ago with gradually worsening course since that time. The patient noted no past surgery on the left knee(s).  Patient currently rates pain in the left knee(s) at 5 out of 10 with activity. Patient has worsening of pain with activity and weight bearing and pain that interferes with activities of daily living.  Patient has evidence of subchondral cysts and joint space narrowing by imaging studies. This patient has had  There is no active infection.  Patient Active Problem List   Diagnosis Date Noted   Unilateral primary osteoarthritis, left knee 03/12/2023   S/P lumbar fusion 07/18/2017   Postoperative urinary retention 06/03/2017   Lumbar stenosis with neurogenic claudication 05/30/2017   Past Medical History:  Diagnosis Date   Arthritis    Fibromyalgia    Glaucoma    High cholesterol    History of blood transfusion    Hypertension    Kidney cysts    effecting creat.   Scoliosis    Sleep apnea    intermittently uses cpap ( started 2 weeks ago)   Spinal stenosis     Past Surgical History:  Procedure Laterality Date   ABDOMINAL HYSTERECTOMY     ANKLE FRACTURE SURGERY     BACK SURGERY     CERVICAL SPINE SURGERY  06/2016   [facility]   CHOLECYSTECTOMY     EYE SURGERY Bilateral    placed duct tubes   FINGER SURGERY     KNEE ARTHROPLASTY Right 04/29/2014   Procedure: COMPUTER ASSISTED TOTAL KNEE ARTHROPLASTY;  Surgeon: Mark C Yates, MD;  Location: [facility] OR;  Service: Orthopedics;  Laterality: Right;  Cemented Right  Total Knee Arthroplasty    Current Outpatient Medications  Medication Sig Dispense Refill Last Dose   acetaminophen (TYLENOL) 500 MG tablet Take 1,000 mg by mouth in the morning and at bedtime.      allopurinol (ZYLOPRIM) 100 MG tablet Take 100 mg by mouth 2 (two) times daily.      amLODipine (NORVASC) 10 MG tablet Take 10 mg by mouth at bedtime.      CALCIUM CITRATE-VITAMIN D PO Take 2 tablets by mouth daily.      cetirizine (ZYRTEC) 10 MG tablet Take 10 mg by mouth daily.      Cholecalciferol (VITAMIN D3) 1000 units CAPS Take 1,000 Units by mouth daily.      diclofenac (CATAFLAM) 50 MG tablet Take 50 mg by mouth daily.      furosemide (LASIX) 20 MG tablet Take 20 mg by mouth daily.      gabapentin (NEURONTIN) 300 MG capsule Take 600 mg by mouth 2 (two) times daily.      MAGNESIUM PO Take 400 mg by mouth daily.      Multiple Vitamins-Minerals (MULTIVITAMIN WITH MINERALS) tablet Take 1 tablet by mouth daily.      potassium chloride (KLOR-CON M) 10 MEQ tablet Take 10 mEq by mouth daily.      pravastatin (PRAVACHOL) 40 MG tablet Take 40 mg by mouth at bedtime.      raloxifene (EVISTA) 60 MG tablet Take 60 mg   by mouth daily.      timolol (TIMOPTIC) 0.5 % ophthalmic solution Place 1 drop into both eyes 2 (two) times daily.       traMADol (ULTRAM) 50 MG tablet Take 100 mg by mouth every 6 (six) hours as needed for moderate pain.      No current facility-administered medications for this visit.   Allergies  Allergen Reactions   Chlorhexidine Hives and Rash    Social History   Tobacco Use   Smoking status: Never   Smokeless tobacco: Never  Substance Use Topics   Alcohol use: Yes    Comment: rare    No family history on file.   Review of Systems  All other systems reviewed and are negative.   Objective:  Physical Exam  Physical Exam Constitutional:      Appearance: She is well-developed.  HENT:     Head: Normocephalic.     Right Ear: External ear normal.     Left Ear:  External ear normal. There is no impacted cerumen.  Eyes:     Pupils: Pupils are equal, round, and reactive to light.  Neck:     Thyroid: No thyromegaly.     Trachea: No tracheal deviation.  Cardiovascular:     Rate and Rhythm: Normal rate.  Pulmonary:     Effort: Pulmonary effort is normal.  Abdominal:     Palpations: Abdomen is soft.  Musculoskeletal:     Cervical back: No rigidity.  Skin:    General: Skin is warm and dry.  Neurological:     Mental Status: She is alert and oriented to person, place, and time.  Psychiatric:        Behavior: Behavior normal.  Ortho ExamLeft knee crepitus with 10 to 100 degrees range of motion.  Medial lateral joint line tenderness palpable osteophytes.  Negative logroll hips.  Distal pulses are intact.  Vital signs in last 24 hours: @VSRANGES@  Labs:   Estimated body mass index is 33.28 kg/m as calculated from the following:   Height as of 03/04/23: 5' 5" (1.651 m).   Weight as of 03/04/23: 200 lb (90.7 kg).   Imaging Review Plain radiographs demonstrate moderate degenerative joint disease of the left knee(s). The overall alignment isneutral. The bone quality appears to be good for age and reported activity level.      Assessment/Plan:  End stage arthritis, left knee   The patient history, physical examination, clinical judgment of the provider and imaging studies are consistent with end stage degenerative joint disease of the left knee(s) and total knee arthroplasty is deemed medically necessary. The treatment options including medical management, injection therapy arthroscopy and arthroplasty were discussed at length. The risks and benefits of total knee arthroplasty were presented and reviewed. The risks due to aseptic loosening, infection, stiffness, patella tracking problems, thromboembolic complications and other imponderables were discussed. The patient acknowledged the explanation, agreed to proceed with the plan and consent was  signed. Patient is being admitted for inpatient treatment for surgery, pain control, PT, OT, prophylactic antibiotics, VTE prophylaxis, progressive ambulation and ADL's and discharge planning. The patient is planning to be discharged home with home health services     Patient's anticipated LOS is less than 2 midnights, meeting these requirements: - Younger than 65 - Lives within 1 hour of care - Has a competent adult at home to recover with post-op recover - NO history of  - Chronic pain requiring opiods  - Diabetes  - Coronary Artery Disease  -   Heart failure  - Heart attack  - Stroke  - DVT/VTE  - Cardiac arrhythmia  - Respiratory Failure/COPD  - Renal failure  - Anemia  - Advanced Liver disease   

## 2023-04-14 NOTE — Progress Notes (Signed)
Office Visit Note   Patient: Joanna Castro           Date of Birth: September 21, 1946           MRN: [id number] Visit Date: 04/14/2023              Requested by: Charlette Caffey, MD [address] [place],  [place] [address] PCP: Charlette Caffey, MD  No chief complaint on file.     HPI: Patient is a pleasant 77 year old woman who presents today for her preoperative history and physical for her upcoming left total knee arthroplasty.  She has failed conservative treatment of this problem.  She did visit with Dr. Ophelia Charter who reviewed the surgery and recovery.  No recent illness.  Assessment & Plan: Visit Diagnoses:  1. Unilateral primary osteoarthritis, left knee     Plan: Patient and reviewed risks which include bleeding infection anesthesia complications need for future surgery blood clots.  Full H&P dictated into the hospital system  Follow-Up Instructions: No follow-ups on file.   Ortho Exam  Patient is alert, oriented, no adenopathy, well-dressed, normal affect, normal respiratory effort. Ortho ExamLeft knee crepitus with 10 to 100 degrees range of motion.  Medial lateral joint line tenderness palpable osteophytes.  Negative logroll hips.  Distal pulses are intact.   Imaging: No results found. No images are attached to the encounter.  Labs: Lab Results  Component Value Date   GRAMSTAIN No WBC Seen 06/09/2017   GRAMSTAIN No Squamous Epithelial Cells Seen 06/09/2017   GRAMSTAIN No Organisms Seen 06/09/2017   GRAMSTAIN Gram Stain Report Called to,Read Back By 06/09/2017   GRAMSTAIN and Verified With: 06/09/2017   GRAMSTAIN [email or web address].M AT 0925AM ON 06/10/2017 BY WEBSP 06/09/2017   LABORGA NORMAL SKIN FLORA 06/09/2017     Lab Results  Component Value Date   ALBUMIN 4.6 05/18/2017   ALBUMIN 3.8 04/22/2014   ALBUMIN 3.7 11/09/2010    No results found for: "MG" No results found for: "VD25OH"  No results found for: "PREALBUMIN"    Latest Ref Rng & Units 06/02/2017     3:41 AM 06/01/2017    5:34 AM 05/31/2017    5:37 AM  CBC EXTENDED  WBC 4.0 - 10.5 K/uL 10.0  9.9  10.8   RBC 3.87 - 5.11 MIL/uL 3.21  3.18  3.46   Hemoglobin 12.0 - 15.0 g/dL 8.8  8.7  9.6   HCT 19.1 - 46.0 % 26.4  26.6  29.3   Platelets 150 - 400 K/uL 174  165  180      There is no height or weight on file to calculate BMI.  Orders:  No orders of the defined types were placed in this encounter.  No orders of the defined types were placed in this encounter.    Procedures: No procedures performed  Clinical Data: No additional findings.  ROS:  All other systems negative, except as noted in the HPI. Review of Systems  Objective: Vital Signs: There were no vitals taken for this visit.  Specialty Comments:  No specialty comments available.  PMFS History: Patient Active Problem List   Diagnosis Date Noted   Unilateral primary osteoarthritis, left knee 03/12/2023   S/P lumbar fusion 07/18/2017   Postoperative urinary retention 06/03/2017   Lumbar stenosis with neurogenic claudication 05/30/2017   Past Medical History:  Diagnosis Date   Arthritis    Fibromyalgia    Glaucoma    High cholesterol    History of blood transfusion  Hypertension    Kidney cysts    effecting creat.   Scoliosis    Sleep apnea    intermittently uses cpap ( started 2 weeks ago)   Spinal stenosis     No family history on file.  Past Surgical History:  Procedure Laterality Date   ABDOMINAL HYSTERECTOMY     ANKLE FRACTURE SURGERY     BACK SURGERY     CERVICAL SPINE SURGERY  06/2016   [facility]   CHOLECYSTECTOMY     EYE SURGERY Bilateral    placed duct tubes   FINGER SURGERY     KNEE ARTHROPLASTY Right 04/29/2014   Procedure: COMPUTER ASSISTED TOTAL KNEE ARTHROPLASTY;  Surgeon: Eldred Manges, MD;  Location: [facility] OR;  Service: Orthopedics;  Laterality: Right;  Cemented Right Total Knee Arthroplasty   Social History   Occupational History   Not on file  Tobacco Use    Smoking status: Never   Smokeless tobacco: Never  Substance and Sexual Activity   Alcohol use: Yes    Comment: rare   Drug use: No   Sexual activity: Not on file

## 2023-04-15 NOTE — Pre-Procedure Instructions (Signed)
Surgical Instructions    Your procedure is scheduled on April 25, 2023.  Report to [facility] Main Entrance "A" at 10:30 A.M., then check in with the Admitting office.  Call this number if you have problems the morning of surgery:  [phone]  If you have any questions prior to your surgery date call [phone]: Open Monday-Friday 8am-4pm If you experience any cold or flu symptoms such as cough, fever, chills, shortness of breath, etc. between now and your scheduled surgery, please notify us at the above number.     Remember:  Do not eat after midnight the night before your surgery  You may drink clear liquids until 9:30 AM the morning of your surgery.   Clear liquids allowed are: Water, Non-Citrus Juices (without pulp), Carbonated Beverages, Clear Tea, Black Coffee Only (NO MILK, CREAM OR POWDERED CREAMER of any kind), and Gatorade.  Patient Instructions  The night before surgery:  No food after midnight. ONLY clear liquids after midnight  The day of surgery (if you do NOT have diabetes):  Drink ONE (1) Pre-Surgery Clear Ensure by 9:30 AM the morning of surgery. Drink in one sitting. Do not sip.  This drink was given to you during your hospital  pre-op appointment visit.  Nothing else to drink after completing the  Pre-Surgery Clear Ensure.         If you have questions, please contact your surgeon's office.     Take these medicines the morning of surgery with A SIP OF WATER:  acetaminophen (TYLENOL)   allopurinol (ZYLOPRIM)   cetirizine (ZYRTEC)   gabapentin (NEURONTIN)   raloxifene (EVISTA)   timolol (TIMOPTIC) ophthalmic solution   traMADol (ULTRAM) - may take if needed   As of today, STOP taking any Aspirin (unless otherwise instructed by your surgeon) Aleve, Naproxen, Ibuprofen, Motrin, Advil, Goody's, BC's, all herbal medications, fish oil, and all vitamins. This includes your medication: diclofenac (CATAFLAM)                      Do NOT Smoke (Tobacco/Vaping)  for 24 hours prior to your procedure.  If you use a CPAP at night, you may bring your mask/headgear for your overnight stay.   Contacts, glasses, piercing's, hearing aid's, dentures or partials may not be worn into surgery, please bring cases for these belongings.    For patients admitted to the hospital, discharge time will be determined by your treatment team.   Patients discharged the day of surgery will not be allowed to drive home, and someone needs to stay with them for 24 hours.  SURGICAL WAITING ROOM VISITATION Patients having surgery or a procedure may have no more than 2 support people in the waiting area - these visitors may rotate.   Children under the age of 25 must have an adult with them who is not the patient. If the patient needs to stay at the hospital during part of their recovery, the visitor guidelines for inpatient rooms apply. Pre-op nurse will coordinate an appropriate time for 1 support person to accompany patient in pre-op.  This support person may not rotate.   Please refer to the [facility] website for the visitor guidelines for Inpatients (after your surgery is over and you are in a regular room).   If you received a COVID test during your pre-op visit  it is requested that you wear a mask when out in public, stay away from anyone that may not be feeling well and notify your surgeon  if you develop symptoms. If you have been in contact with anyone that has tested positive in the last 10 days please notify you surgeon.     Pre-operative 5 CHG Bath Instructions   You can play a key role in reducing the risk of infection after surgery. Your skin needs to be as free of germs as possible. You can reduce the number of germs on your skin by washing with CHG (chlorhexidine gluconate) soap before surgery. CHG is an antiseptic soap that kills germs and continues to kill germs even after washing.   DO NOT use if you have an allergy to chlorhexidine/CHG or antibacterial  soaps. If your skin becomes reddened or irritated, stop using the CHG and notify one of our RNs at [phone].   Please shower with the CHG soap starting 4 days before surgery using the following schedule:     Please keep in mind the following:  DO NOT shave, including legs and underarms, starting the day of your first shower.   You may shave your face at any point before/day of surgery.  Place clean sheets on your bed the day you start using CHG soap. Use a clean washcloth (not used since being washed) for each shower. DO NOT sleep with pets once you start using the CHG.   CHG Shower Instructions:  If you choose to wash your hair and private area, wash first with your normal shampoo/soap.  After you use shampoo/soap, rinse your hair and body thoroughly to remove shampoo/soap residue.  Turn the water OFF and apply about 3 tablespoons (45 ml) of CHG soap to a CLEAN washcloth.  Apply CHG soap ONLY FROM YOUR NECK DOWN TO YOUR TOES (washing for 3-5 minutes)  DO NOT use CHG soap on face, private areas, open wounds, or sores.  Pay special attention to the area where your surgery is being performed.  If you are having back surgery, having someone wash your back for you may be helpful. Wait 2 minutes after CHG soap is applied, then you may rinse off the CHG soap.  Pat dry with a clean towel  Put on clean clothes/pajamas   If you choose to wear lotion, please use ONLY the CHG-compatible lotions on the back of this paper.     Additional instructions for the day of surgery: DO NOT APPLY any lotions, deodorants, cologne, or perfumes.   Do not wear jewelry or makeup Do not wear nail polish, gel polish, artificial nails, or any other type of covering on natural nails (fingers and toes) Do not bring valuables to the hospital. [facility] is not responsible for any belongings or valuables. Put on clean/comfortable clothes.  Brush your teeth.  Ask your nurse before applying any prescription  medications to the skin.      CHG Compatible Lotions   Aveeno Moisturizing lotion  Cetaphil Moisturizing Cream  Cetaphil Moisturizing Lotion  Clairol Herbal Essence Moisturizing Lotion, Dry Skin  Clairol Herbal Essence Moisturizing Lotion, Extra Dry Skin  Clairol Herbal Essence Moisturizing Lotion, Normal Skin  Curel Age Defying Therapeutic Moisturizing Lotion with Alpha Hydroxy  Curel Extreme Care Body Lotion  Curel Soothing Hands Moisturizing Hand Lotion  Curel Therapeutic Moisturizing Cream, Fragrance-Free  Curel Therapeutic Moisturizing Lotion, Fragrance-Free  Curel Therapeutic Moisturizing Lotion, Original Formula  Eucerin Daily Replenishing Lotion  Eucerin Dry Skin Therapy Plus Alpha Hydroxy Crme  Eucerin Dry Skin Therapy Plus Alpha Hydroxy Lotion  Eucerin Original Crme  Eucerin Original Lotion  Eucerin Plus Crme Eucerin Plus Lotion  Eucerin TriLipid Replenishing Lotion  Keri Anti-Bacterial Hand Lotion  Keri Deep Conditioning Original Lotion Dry Skin Formula Softly Scented  Keri Deep Conditioning Original Lotion, Fragrance Free Sensitive Skin Formula  Keri Lotion Fast Absorbing Fragrance Free Sensitive Skin Formula  Keri Lotion Fast Absorbing Softly Scented Dry Skin Formula  Keri Original Lotion  Keri Skin Renewal Lotion Keri Silky Smooth Lotion  Keri Silky Smooth Sensitive Skin Lotion  Nivea Body Creamy Conditioning Oil  Nivea Body Extra Enriched Lotion  Nivea Body Original Lotion  Nivea Body Sheer Moisturizing Lotion Nivea Crme  Nivea Skin Firming Lotion  NutraDerm 30 Skin Lotion  NutraDerm Skin Lotion  NutraDerm Therapeutic Skin Cream  NutraDerm Therapeutic Skin Lotion  ProShield Protective Hand Cream  Provon moisturizing lotion   Please read over the following fact sheets that you were given.

## 2023-04-18 ENCOUNTER — Encounter (HOSPITAL_COMMUNITY): Payer: Self-pay

## 2023-04-18 ENCOUNTER — Other Ambulatory Visit: Payer: Self-pay

## 2023-04-18 ENCOUNTER — Encounter (HOSPITAL_COMMUNITY)
Admission: RE | Admit: 2023-04-18 | Discharge: 2023-04-18 | Disposition: A | Discharge status: Home / Self Care | Payer: Medicare HMO | Source: Ambulatory Visit | Attending: Orthopaedic Surgery | Admitting: Orthopaedic Surgery

## 2023-04-18 VITALS — BP 134/67 | HR 76 | Temp 98.0°F | Resp 18 | Ht 65.0 in | Wt 200.7 lb

## 2023-04-18 DIAGNOSIS — G4733 Obstructive sleep apnea (adult) (pediatric): Secondary | ICD-10-CM | POA: Diagnosis not present

## 2023-04-18 DIAGNOSIS — Z01812 Encounter for preprocedural laboratory examination: Secondary | ICD-10-CM | POA: Diagnosis present

## 2023-04-18 DIAGNOSIS — I10 Essential (primary) hypertension: Secondary | ICD-10-CM | POA: Insufficient documentation

## 2023-04-18 DIAGNOSIS — Z01818 Encounter for other preprocedural examination: Secondary | ICD-10-CM

## 2023-04-18 DIAGNOSIS — E785 Hyperlipidemia, unspecified: Secondary | ICD-10-CM | POA: Insufficient documentation

## 2023-04-18 DIAGNOSIS — M797 Fibromyalgia: Secondary | ICD-10-CM | POA: Insufficient documentation

## 2023-04-18 HISTORY — DX: Malignant (primary) neoplasm, unspecified: C80.1

## 2023-04-18 LAB — CBC
HCT: 41.7 % (ref 36.0–46.0)
Hemoglobin: 13.4 g/dL (ref 12.0–15.0)
MCH: 29.5 pg (ref 26.0–34.0)
MCHC: 32.1 g/dL (ref 30.0–36.0)
MCV: 91.9 fL (ref 80.0–100.0)
Platelets: 234 10*3/uL (ref 150–400)
RBC: 4.54 MIL/uL (ref 3.87–5.11)
RDW: 13.1 % (ref 11.5–15.5)
WBC: 6.2 10*3/uL (ref 4.0–10.5)
nRBC: 0 % (ref 0.0–0.2)

## 2023-04-18 LAB — BASIC METABOLIC PANEL
Anion gap: 12 (ref 5–15)
BUN: 16 mg/dL (ref 8–23)
CO2: 28 mmol/L (ref 22–32)
Calcium: 10.2 mg/dL (ref 8.9–10.3)
Chloride: 101 mmol/L (ref 98–111)
Creatinine, Ser: 1.02 mg/dL — ABNORMAL HIGH (ref 0.44–1.00)
GFR, Estimated: 57 mL/min — ABNORMAL LOW (ref >60)
Glucose, Bld: 124 mg/dL — ABNORMAL HIGH (ref 70–99)
Potassium: 3.9 mmol/L (ref 3.5–5.1)
Sodium: 141 mmol/L (ref 135–145)

## 2023-04-18 LAB — SURGICAL PCR SCREEN
MRSA, PCR: NEGATIVE
Staphylococcus aureus: NEGATIVE

## 2023-04-18 NOTE — Progress Notes (Signed)
PCP - Dr. 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 Patient agrees to go over the instructions while at home for a better understanding.  The opportunity to ask questions was provided.

## 2023-04-21 NOTE — Progress Notes (Signed)
Anesthesia Chart Review:  77 year old female with pertinent history including OSA on CPAP, HTN, HLD, fibromyalgia.  Patient reports remote benign cardiology evaluation.  States she had a stress test ~30 years ago at [facility] which was normal and echocardiogram ~10 years ago through [facility] which she also states was benign.  She denies any current cardiopulmonary complaints.  Denies any known history of cardiovascular disease.  Chronic medical conditions are followed by PCP Dr. Louis Meckel at [facility].  Clearance from Dr. Louis Meckel dated 03/23/2023 states patient is cleared to undergo left TKA.  EKG requested x 2 from [facility] medical. Will need tracing done DOS if not received.   Preop labs reviewed, unremarkable.    Zannie Cove [facility] Short Stay Center/Anesthesiology Phone [phone] 04/22/2023 2:28 PM

## 2023-04-22 NOTE — Anesthesia Preprocedure Evaluation (Signed)
Anesthesia Evaluation  Patient identified by MRN, date of birth, ID band Patient awake    Reviewed: Allergy & Precautions, NPO status , Patient's Chart, lab work & pertinent test results  History of Anesthesia Complications Negative for: history of anesthetic complications  Airway Mallampati: II  TM Distance: >3 FB Neck ROM: Full    Dental  (+) Dental Advisory Given   Pulmonary sleep apnea and Continuous Positive Airway Pressure Ventilation    breath sounds clear to auscultation       Cardiovascular hypertension, Pt. on medications (-) angina  Rhythm:Regular Rate:Normal     Neuro/Psych glaucoma    GI/Hepatic negative GI ROS, Neg liver ROS,,,  Endo/Other  BMI 33  Renal/GU negative Renal ROS     Musculoskeletal  (+) Arthritis ,  Fibromyalgia -  Abdominal   Peds  Hematology Hb 13.4, plt 234k   Anesthesia Other Findings   Reproductive/Obstetrics                             Anesthesia Physical Anesthesia Plan  ASA: 3  Anesthesia Plan: Spinal   Post-op Pain Management: Regional block* and Tylenol PO (pre-op)*   Induction:   PONV Risk Score and Plan: 2 and Ondansetron and Treatment may vary due to age or medical condition  Airway Management Planned: Natural Airway and Simple Face Mask  Additional Equipment: None  Intra-op Plan:   Post-operative Plan:   Informed Consent: I have reviewed the patients History and Physical, chart, labs and discussed the procedure including the risks, benefits and alternatives for the proposed anesthesia with the patient or authorized representative who has indicated his/her understanding and acceptance.     Dental advisory given  Plan Discussed with: CRNA and Surgeon  Anesthesia Plan Comments: (Plan routine monitors, SAB with adductor canal block for post op analgesia  PAT note by Antionette Poles, PA-C: 77 year old female with pertinent history  including OSA on CPAP, HTN, HLD, fibromyalgia.  Patient reports remote benign cardiology evaluation.  States she had a stress test ~30 years ago at [facility] which was normal and echocardiogram ~10 years ago through [facility] which she also states was benign.  She denies any current cardiopulmonary complaints.  Denies any known history of cardiovascular disease.  Chronic medical conditions are followed by PCP Dr. Louis Meckel at [facility].  Clearance from Dr. Louis Meckel dated 03/23/2023 states patient is cleared to undergo left TKA.  EKG requested x 2 from [facility] medical. Will need tracing done DOS if not received.   Preop labs reviewed, unremarkable.    )        Anesthesia Quick Evaluation

## 2023-04-25 ENCOUNTER — Ambulatory Visit (HOSPITAL_COMMUNITY): Payer: Medicare HMO | Admitting: Physician Assistant

## 2023-04-25 ENCOUNTER — Encounter (HOSPITAL_COMMUNITY)
Admission: RE | Disposition: A | Discharge status: Home / Self Care | Payer: Self-pay | Source: Home / Self Care | Attending: Orthopaedic Surgery

## 2023-04-25 ENCOUNTER — Other Ambulatory Visit: Payer: Self-pay

## 2023-04-25 ENCOUNTER — Observation Stay (HOSPITAL_COMMUNITY)
Admission: RE | Admit: 2023-04-25 | Discharge: 2023-04-27 | Disposition: A | Discharge status: Home / Self Care | Payer: Medicare HMO | Attending: Orthopaedic Surgery | Admitting: Orthopaedic Surgery

## 2023-04-25 ENCOUNTER — Encounter (HOSPITAL_COMMUNITY): Payer: Self-pay | Admitting: Orthopaedic Surgery

## 2023-04-25 ENCOUNTER — Ambulatory Visit (HOSPITAL_BASED_OUTPATIENT_CLINIC_OR_DEPARTMENT_OTHER): Payer: Medicare HMO | Admitting: Physician Assistant

## 2023-04-25 DIAGNOSIS — G4730 Sleep apnea, unspecified: Secondary | ICD-10-CM | POA: Diagnosis not present

## 2023-04-25 DIAGNOSIS — Z96651 Presence of right artificial knee joint: Secondary | ICD-10-CM | POA: Insufficient documentation

## 2023-04-25 DIAGNOSIS — Z79899 Other long term (current) drug therapy: Secondary | ICD-10-CM | POA: Diagnosis not present

## 2023-04-25 DIAGNOSIS — M1712 Unilateral primary osteoarthritis, left knee: Principal | ICD-10-CM | POA: Insufficient documentation

## 2023-04-25 DIAGNOSIS — I10 Essential (primary) hypertension: Secondary | ICD-10-CM | POA: Insufficient documentation

## 2023-04-25 DIAGNOSIS — Z9989 Dependence on other enabling machines and devices: Secondary | ICD-10-CM

## 2023-04-25 DIAGNOSIS — Z96652 Presence of left artificial knee joint: Secondary | ICD-10-CM

## 2023-04-25 HISTORY — PX: TOTAL KNEE ARTHROPLASTY: SHX125

## 2023-04-25 HISTORY — DX: Plantar fascial fibromatosis: M72.2

## 2023-04-25 SURGERY — ARTHROPLASTY, KNEE, TOTAL
Anesthesia: Spinal | Site: Knee | Laterality: Left

## 2023-04-25 MED ORDER — ACETAMINOPHEN 500 MG PO TABS
1000.0000 mg | ORAL_TABLET | Freq: Four times a day (QID) | ORAL | Status: AC
  Administered 2023-04-25 – 2023-04-26 (×4): 1000 mg via ORAL
  Filled 2023-04-25 (×4): qty 2

## 2023-04-25 MED ORDER — DOCUSATE SODIUM 100 MG PO CAPS
100.0000 mg | ORAL_CAPSULE | Freq: Two times a day (BID) | ORAL | Status: DC
  Administered 2023-04-25 – 2023-04-27 (×4): 100 mg via ORAL
  Filled 2023-04-25 (×4): qty 1

## 2023-04-25 MED ORDER — PHENOL 1.4 % MT LIQD: 1.0000 | OROMUCOSAL | Status: DC | PRN

## 2023-04-25 MED ORDER — FUROSEMIDE 20 MG PO TABS
20.0000 mg | ORAL_TABLET | Freq: Every day | ORAL | Status: DC
  Administered 2023-04-26 – 2023-04-27 (×2): 20 mg via ORAL
  Filled 2023-04-25 (×2): qty 1

## 2023-04-25 MED ORDER — TRANEXAMIC ACID-NACL 1000-0.7 MG/100ML-% IV SOLN
1000.0000 mg | INTRAVENOUS | Status: AC
  Administered 2023-04-25: 1000 mg via INTRAVENOUS
  Filled 2023-04-25: qty 100

## 2023-04-25 MED ORDER — BUPIVACAINE HCL (PF) 0.25 % IJ SOLN
INTRAMUSCULAR | Status: AC
  Filled 2023-04-25: qty 30

## 2023-04-25 MED ORDER — ASPIRIN 325 MG PO TBEC
325.0000 mg | DELAYED_RELEASE_TABLET | Freq: Every day | ORAL | Status: DC
  Administered 2023-04-27: 325 mg via ORAL
  Filled 2023-04-25 (×2): qty 1

## 2023-04-25 MED ORDER — MIDAZOLAM HCL 2 MG/2ML IJ SOLN: 1.0000 mg | Freq: Once | INTRAMUSCULAR | Status: AC

## 2023-04-25 MED ORDER — GABAPENTIN 300 MG PO CAPS
600.0000 mg | ORAL_CAPSULE | Freq: Two times a day (BID) | ORAL | Status: DC
  Administered 2023-04-25 – 2023-04-27 (×4): 600 mg via ORAL
  Filled 2023-04-25 (×4): qty 2

## 2023-04-25 MED ORDER — DEXAMETHASONE SODIUM PHOSPHATE 10 MG/ML IJ SOLN
INTRAMUSCULAR | Status: DC | PRN
  Administered 2023-04-25: 5 mg via INTRAVENOUS

## 2023-04-25 MED ORDER — FENTANYL CITRATE (PF) 100 MCG/2ML IJ SOLN: 50.0000 ug | Freq: Once | INTRAMUSCULAR | Status: AC

## 2023-04-25 MED ORDER — OXYCODONE HCL 5 MG/5ML PO SOLN: 5.0000 mg | Freq: Once | ORAL | Status: DC | PRN

## 2023-04-25 MED ORDER — BUPIVACAINE LIPOSOME 1.3 % IJ SUSP
INTRAMUSCULAR | Status: DC | PRN
  Administered 2023-04-25: 40 mL

## 2023-04-25 MED ORDER — DIPHENHYDRAMINE HCL 12.5 MG/5ML PO ELIX: 12.5000 mg | ORAL_SOLUTION | ORAL | Status: DC | PRN

## 2023-04-25 MED ORDER — TIMOLOL MALEATE 0.5 % OP SOLN
1.0000 [drp] | Freq: Two times a day (BID) | OPHTHALMIC | Status: DC
  Administered 2023-04-25 – 2023-04-27 (×4): 1 [drp] via OPHTHALMIC
  Filled 2023-04-25: qty 5

## 2023-04-25 MED ORDER — ORAL CARE MOUTH RINSE
15.0000 mL | Freq: Once | OROMUCOSAL | Status: AC
  Administered 2023-04-25: 15 mL via OROMUCOSAL

## 2023-04-25 MED ORDER — LORATADINE 10 MG PO TABS
10.0000 mg | ORAL_TABLET | Freq: Every day | ORAL | Status: DC
  Administered 2023-04-26 – 2023-04-27 (×2): 10 mg via ORAL
  Filled 2023-04-25 (×2): qty 1

## 2023-04-25 MED ORDER — LACTATED RINGERS IV SOLN: INTRAVENOUS | Status: DC

## 2023-04-25 MED ORDER — HYDROMORPHONE HCL 1 MG/ML IJ SOLN
0.5000 mg | INTRAMUSCULAR | Status: DC | PRN
  Administered 2023-04-26: 0.5 mg via INTRAVENOUS
  Administered 2023-04-27: 1 mg via INTRAVENOUS
  Filled 2023-04-25 (×2): qty 1

## 2023-04-25 MED ORDER — CALCIUM CITRATE 950 (200 CA) MG PO TABS
200.0000 mg | ORAL_TABLET | Freq: Every day | ORAL | Status: DC
  Administered 2023-04-26 – 2023-04-27 (×2): 200 mg via ORAL
  Filled 2023-04-25 (×2): qty 1

## 2023-04-25 MED ORDER — POLYETHYLENE GLYCOL 3350 17 G PO PACK: 17.0000 g | PACK | Freq: Every day | ORAL | Status: DC | PRN

## 2023-04-25 MED ORDER — FERROUS SULFATE 325 (65 FE) MG PO TABS
325.0000 mg | ORAL_TABLET | Freq: Three times a day (TID) | ORAL | Status: DC
  Filled 2023-04-25: qty 1

## 2023-04-25 MED ORDER — PROPOFOL 1000 MG/100ML IV EMUL
INTRAVENOUS | Status: AC
  Filled 2023-04-25: qty 100

## 2023-04-25 MED ORDER — MAGNESIUM OXIDE -MG SUPPLEMENT 400 (240 MG) MG PO TABS
400.0000 mg | ORAL_TABLET | Freq: Every day | ORAL | Status: DC
  Administered 2023-04-26 – 2023-04-27 (×2): 400 mg via ORAL
  Filled 2023-04-25 (×2): qty 1

## 2023-04-25 MED ORDER — ONDANSETRON HCL 4 MG/2ML IJ SOLN
INTRAMUSCULAR | Status: AC
  Filled 2023-04-25: qty 2

## 2023-04-25 MED ORDER — ACETAMINOPHEN 500 MG PO TABS: 1000.0000 mg | ORAL_TABLET | Freq: Once | ORAL | Status: DC

## 2023-04-25 MED ORDER — HYDROMORPHONE HCL 1 MG/ML IJ SOLN: 0.2500 mg | INTRAMUSCULAR | Status: DC | PRN

## 2023-04-25 MED ORDER — PROPOFOL 500 MG/50ML IV EMUL
INTRAVENOUS | Status: DC | PRN
  Administered 2023-04-25: 25 ug/kg/min via INTRAVENOUS

## 2023-04-25 MED ORDER — OXYCODONE HCL 5 MG PO TABS
10.0000 mg | ORAL_TABLET | ORAL | Status: DC | PRN
  Administered 2023-04-25 – 2023-04-27 (×7): 15 mg via ORAL
  Filled 2023-04-25 (×7): qty 3

## 2023-04-25 MED ORDER — ADULT MULTIVITAMIN W/MINERALS CH
1.0000 | ORAL_TABLET | Freq: Every day | ORAL | Status: DC
  Administered 2023-04-26 – 2023-04-27 (×2): 1 via ORAL
  Filled 2023-04-25 (×5): qty 1

## 2023-04-25 MED ORDER — ACETAMINOPHEN 325 MG PO TABS: 325.0000 mg | ORAL_TABLET | Freq: Four times a day (QID) | ORAL | Status: DC | PRN

## 2023-04-25 MED ORDER — METOCLOPRAMIDE HCL 5 MG PO TABS: 5.0000 mg | ORAL_TABLET | Freq: Three times a day (TID) | ORAL | Status: DC | PRN

## 2023-04-25 MED ORDER — SODIUM CHLORIDE 0.9 % IR SOLN
Status: DC | PRN
  Administered 2023-04-25: 3000 mL

## 2023-04-25 MED ORDER — POTASSIUM CHLORIDE CRYS ER 10 MEQ PO TBCR
10.0000 meq | EXTENDED_RELEASE_TABLET | Freq: Every day | ORAL | Status: DC
  Administered 2023-04-26 – 2023-04-27 (×2): 10 meq via ORAL
  Filled 2023-04-25 (×2): qty 1

## 2023-04-25 MED ORDER — DEXAMETHASONE SODIUM PHOSPHATE 10 MG/ML IJ SOLN
INTRAMUSCULAR | Status: AC
  Filled 2023-04-25: qty 1

## 2023-04-25 MED ORDER — MAGNESIUM 200 MG PO TABS: 400.0000 mg | ORAL_TABLET | Freq: Every day | ORAL | Status: DC

## 2023-04-25 MED ORDER — PROPOFOL 10 MG/ML IV BOLUS
INTRAVENOUS | Status: DC | PRN
  Administered 2023-04-25: 20 mg via INTRAVENOUS
  Administered 2023-04-25 (×2): 30 mg via INTRAVENOUS
  Administered 2023-04-25: 20 mg via INTRAVENOUS

## 2023-04-25 MED ORDER — ALLOPURINOL 100 MG PO TABS
100.0000 mg | ORAL_TABLET | Freq: Two times a day (BID) | ORAL | Status: DC
  Administered 2023-04-25 – 2023-04-27 (×4): 100 mg via ORAL
  Filled 2023-04-25 (×4): qty 1

## 2023-04-25 MED ORDER — SODIUM CHLORIDE 0.9 % IV SOLN: INTRAVENOUS | Status: DC

## 2023-04-25 MED ORDER — PROMETHAZINE HCL 25 MG/ML IJ SOLN: 6.2500 mg | INTRAMUSCULAR | Status: DC | PRN

## 2023-04-25 MED ORDER — ONDANSETRON HCL 4 MG/2ML IJ SOLN
INTRAMUSCULAR | Status: DC | PRN
  Administered 2023-04-25: 4 mg via INTRAVENOUS

## 2023-04-25 MED ORDER — BUPIVACAINE LIPOSOME 1.3 % IJ SUSP
INTRAMUSCULAR | Status: AC
  Filled 2023-04-25: qty 20

## 2023-04-25 MED ORDER — AMLODIPINE BESYLATE 10 MG PO TABS
10.0000 mg | ORAL_TABLET | Freq: Every day | ORAL | Status: DC
  Administered 2023-04-25 – 2023-04-26 (×2): 10 mg via ORAL
  Filled 2023-04-25 (×2): qty 1

## 2023-04-25 MED ORDER — CALCIUM CITRATE-VITAMIN D 315-6.25 MG-MCG PO TABS: ORAL_TABLET | Freq: Every day | ORAL | Status: DC

## 2023-04-25 MED ORDER — ROPIVACAINE HCL 7.5 MG/ML IJ SOLN
INTRAMUSCULAR | Status: DC | PRN
  Administered 2023-04-25: 20 mL via PERINEURAL

## 2023-04-25 MED ORDER — BUPIVACAINE IN DEXTROSE 0.75-8.25 % IT SOLN
INTRATHECAL | Status: DC | PRN
  Administered 2023-04-25: 12 mg via INTRATHECAL

## 2023-04-25 MED ORDER — PRAVASTATIN SODIUM 40 MG PO TABS
40.0000 mg | ORAL_TABLET | Freq: Every day | ORAL | Status: DC
  Administered 2023-04-25 – 2023-04-26 (×2): 40 mg via ORAL
  Filled 2023-04-25 (×2): qty 1

## 2023-04-25 MED ORDER — ONDANSETRON HCL 4 MG PO TABS: 4.0000 mg | ORAL_TABLET | Freq: Four times a day (QID) | ORAL | Status: DC | PRN

## 2023-04-25 MED ORDER — OXYCODONE HCL 5 MG PO TABS: 5.0000 mg | ORAL_TABLET | ORAL | Status: DC | PRN

## 2023-04-25 MED ORDER — MENTHOL 3 MG MT LOZG: 1.0000 | LOZENGE | OROMUCOSAL | Status: DC | PRN

## 2023-04-25 MED ORDER — MIDAZOLAM HCL 2 MG/2ML IJ SOLN
INTRAMUSCULAR | Status: AC
  Administered 2023-04-25: 1 mg via INTRAVENOUS
  Filled 2023-04-25: qty 2

## 2023-04-25 MED ORDER — FENTANYL CITRATE (PF) 100 MCG/2ML IJ SOLN
INTRAMUSCULAR | Status: AC
  Administered 2023-04-25: 50 ug via INTRAVENOUS
  Filled 2023-04-25: qty 2

## 2023-04-25 MED ORDER — CEFAZOLIN SODIUM-DEXTROSE 2-4 GM/100ML-% IV SOLN
2.0000 g | INTRAVENOUS | Status: AC
  Administered 2023-04-25: 2 g via INTRAVENOUS
  Filled 2023-04-25: qty 100

## 2023-04-25 MED ORDER — VITAMIN D 25 MCG (1000 UNIT) PO TABS
1000.0000 [IU] | ORAL_TABLET | Freq: Every day | ORAL | Status: DC
  Administered 2023-04-26 – 2023-04-27 (×2): 1000 [IU] via ORAL
  Filled 2023-04-25 (×2): qty 1

## 2023-04-25 MED ORDER — RALOXIFENE HCL 60 MG PO TABS
60.0000 mg | ORAL_TABLET | Freq: Every day | ORAL | Status: DC
  Administered 2023-04-26 – 2023-04-27 (×2): 60 mg via ORAL
  Filled 2023-04-25 (×2): qty 1

## 2023-04-25 MED ORDER — METOCLOPRAMIDE HCL 5 MG/ML IJ SOLN: 5.0000 mg | Freq: Three times a day (TID) | INTRAMUSCULAR | Status: DC | PRN

## 2023-04-25 MED ORDER — BISACODYL 10 MG RE SUPP: 10.0000 mg | Freq: Every day | RECTAL | Status: DC | PRN

## 2023-04-25 MED ORDER — MIDAZOLAM HCL 2 MG/2ML IJ SOLN: 0.5000 mg | Freq: Once | INTRAMUSCULAR | Status: DC | PRN

## 2023-04-25 MED ORDER — OXYCODONE HCL 5 MG PO TABS: 5.0000 mg | ORAL_TABLET | Freq: Once | ORAL | Status: DC | PRN

## 2023-04-25 MED ORDER — ONDANSETRON HCL 4 MG/2ML IJ SOLN: 4.0000 mg | Freq: Four times a day (QID) | INTRAMUSCULAR | Status: DC | PRN

## 2023-04-25 SURGICAL SUPPLY — 75 items
ATTUNE PS FEM LT SZ 4 CEM KNEE (Femur) IMPLANT
ATTUNE PSRP INSR SZ4 5 KNEE (Insert) IMPLANT
BAG COUNTER SPONGE SURGICOUNT (BAG) ×1 IMPLANT
BAG SPNG CNTER NS LX DISP (BAG) ×1
BANDAGE ESMARK 6X9 LF (GAUZE/BANDAGES/DRESSINGS) ×1 IMPLANT
BASEPLATE TIBIAL ROTATING SZ 4 (Knees) IMPLANT
BLADE SAGITTAL 25.0X1.19X90 (BLADE) ×1 IMPLANT
BLADE SAW SGTL 13X75X1.27 (BLADE) ×1 IMPLANT
BNDG CMPR 5X62 HK CLSR LF (GAUZE/BANDAGES/DRESSINGS) ×1
BNDG CMPR 9X6 STRL LF SNTH (GAUZE/BANDAGES/DRESSINGS) ×1
BNDG CMPR MED 10X6 ELC LF (GAUZE/BANDAGES/DRESSINGS) ×1
BNDG CMPR MED 15X6 ELC VLCR LF (GAUZE/BANDAGES/DRESSINGS) ×1
BNDG CMPR STD VLCR NS LF 5.8X4 (GAUZE/BANDAGES/DRESSINGS) ×1
BNDG ELASTIC 4X5.8 VLCR NS LF (GAUZE/BANDAGES/DRESSINGS) ×1 IMPLANT
BNDG ELASTIC 4X5.8 VLCR STR LF (GAUZE/BANDAGES/DRESSINGS) ×1 IMPLANT
BNDG ELASTIC 6INX 5YD STR LF (GAUZE/BANDAGES/DRESSINGS) IMPLANT
BNDG ELASTIC 6X10 VLCR STRL LF (GAUZE/BANDAGES/DRESSINGS) ×1 IMPLANT
BNDG ELASTIC 6X15 VLCR STRL LF (GAUZE/BANDAGES/DRESSINGS) IMPLANT
BNDG ESMARK 6X9 LF (GAUZE/BANDAGES/DRESSINGS) ×1
BOWL SMART MIX CTS (DISPOSABLE) ×1 IMPLANT
BSPLAT TIB 4 CMNT ROT PLAT STR (Knees) ×1 IMPLANT
CEMENT HV SMART SET (Cement) ×2 IMPLANT
COVER SURGICAL LIGHT HANDLE (MISCELLANEOUS) ×1 IMPLANT
CUFF TOURN SGL QUICK 34 (TOURNIQUET CUFF) ×1
CUFF TOURN SGL QUICK 42 (TOURNIQUET CUFF) IMPLANT
CUFF TRNQT CYL 34X4.125X (TOURNIQUET CUFF) ×1 IMPLANT
DRAPE ORTHO SPLIT 77X108 STRL (DRAPES) ×2
DRAPE SURG ORHT 6 SPLT 77X108 (DRAPES) ×2 IMPLANT
DRAPE U-SHAPE 47X51 STRL (DRAPES) ×1 IMPLANT
DRSG MEPILEX POST OP 4X12 (GAUZE/BANDAGES/DRESSINGS) IMPLANT
DURAPREP 26ML APPLICATOR (WOUND CARE) ×2 IMPLANT
ELECT REM PT RETURN 9FT ADLT (ELECTROSURGICAL) ×1
ELECTRODE REM PT RTRN 9FT ADLT (ELECTROSURGICAL) ×1 IMPLANT
FACESHIELD WRAPAROUND (MASK) ×2 IMPLANT
FACESHIELD WRAPAROUND OR TEAM (MASK) ×2 IMPLANT
GAUZE PAD ABD 8X10 STRL (GAUZE/BANDAGES/DRESSINGS) ×1 IMPLANT
GAUZE SPONGE 4X4 12PLY STRL (GAUZE/BANDAGES/DRESSINGS) IMPLANT
GAUZE XEROFORM 5X9 LF (GAUZE/BANDAGES/DRESSINGS) ×1 IMPLANT
GLOVE BIOGEL PI IND STRL 8 (GLOVE) ×2 IMPLANT
GLOVE ORTHO TXT STRL SZ7.5 (GLOVE) ×2 IMPLANT
GOWN STRL REUS W/ TWL LRG LVL3 (GOWN DISPOSABLE) ×1 IMPLANT
GOWN STRL REUS W/ TWL XL LVL3 (GOWN DISPOSABLE) ×1 IMPLANT
GOWN STRL REUS W/TWL 2XL LVL3 (GOWN DISPOSABLE) ×1 IMPLANT
GOWN STRL REUS W/TWL LRG LVL3 (GOWN DISPOSABLE) ×1
GOWN STRL REUS W/TWL XL LVL3 (GOWN DISPOSABLE) ×1
HANDPIECE INTERPULSE COAX TIP (DISPOSABLE) ×1
IMMOBILIZER KNEE 22 UNIV (SOFTGOODS) ×1 IMPLANT
KIT BASIN OR (CUSTOM PROCEDURE TRAY) ×1 IMPLANT
KIT TURNOVER KIT B (KITS) ×1 IMPLANT
MANIFOLD NEPTUNE II (INSTRUMENTS) ×1 IMPLANT
MARKER SKIN DUAL TIP RULER LAB (MISCELLANEOUS) ×1 IMPLANT
NDL 18GX1X1/2 (RX/OR ONLY) (NEEDLE) ×1 IMPLANT
NDL HYPO 25GX1X1/2 BEV (NEEDLE) ×1 IMPLANT
NEEDLE 18GX1X1/2 (RX/OR ONLY) (NEEDLE) ×1 IMPLANT
NEEDLE HYPO 25GX1X1/2 BEV (NEEDLE) ×1 IMPLANT
NS IRRIG 1000ML POUR BTL (IV SOLUTION) ×1 IMPLANT
PACK TOTAL JOINT (CUSTOM PROCEDURE TRAY) ×1 IMPLANT
PAD ARMBOARD 7.5X6 YLW CONV (MISCELLANEOUS) ×2 IMPLANT
PADDING CAST COTTON 6X4 STRL (CAST SUPPLIES) ×1 IMPLANT
PATELLA MEDIAL ATTUN 35MM KNEE (Knees) IMPLANT
PIN STEINMAN FIXATION KNEE (PIN) IMPLANT
SET HNDPC FAN SPRY TIP SCT (DISPOSABLE) ×1 IMPLANT
STAPLER VISISTAT 35W (STAPLE) IMPLANT
SUCTION TUBE FRAZIER 10FR DISP (SUCTIONS) ×1 IMPLANT
SUT VIC AB 0 CT1 27 (SUTURE) ×1
SUT VIC AB 0 CT1 27XBRD ANBCTR (SUTURE) ×1 IMPLANT
SUT VIC AB 1 CTX 36 (SUTURE) ×2
SUT VIC AB 1 CTX36XBRD ANBCTR (SUTURE) ×2 IMPLANT
SUT VIC AB 2-0 CT1 27 (SUTURE) ×2
SUT VIC AB 2-0 CT1 TAPERPNT 27 (SUTURE) ×2 IMPLANT
SYR 50ML LL SCALE MARK (SYRINGE) ×1 IMPLANT
SYR CONTROL 10ML LL (SYRINGE) ×1 IMPLANT
TOWEL GREEN STERILE (TOWEL DISPOSABLE) ×1 IMPLANT
TOWEL GREEN STERILE FF (TOWEL DISPOSABLE) ×1 IMPLANT
TRAY CATH INTERMITTENT SS 16FR (CATHETERS) IMPLANT

## 2023-04-25 NOTE — Transfer of Care (Signed)
Immediate Anesthesia Transfer of Care Note  Patient: KEYSHA CONTRERA  Procedure(s) Performed: LEFT TOTAL KNEE ARTHROPLASTY (Left: Knee)  Patient Location: PACU  Anesthesia Type:Spinal  Level of Consciousness: awake, alert , and oriented  Airway & Oxygen Therapy: Patient Spontanous Breathing  Post-op Assessment: Report given to RN and Post -op Vital signs reviewed and stable  Post vital signs: Reviewed and stable  Last Vitals:  Vitals Value Taken Time  BP 121/82 04/25/23 1448  Temp    Pulse 75 04/25/23 1453  Resp 10 04/25/23 1453  SpO2 96 % 04/25/23 1453  Vitals shown include unvalidated device data.  Last Pain:  Vitals:   04/25/23 1230  TempSrc:   PainSc: 4       Patients Stated Pain Goal: 0 (04/25/23 1230)  Complications: No notable events documented.

## 2023-04-25 NOTE — Anesthesia Procedure Notes (Signed)
Anesthesia Regional Block: Adductor canal block   Pre-Anesthetic Checklist: , timeout performed,  Correct Patient, Correct Site, Correct Laterality,  Correct Procedure, Correct Position, site marked,  Risks and benefits discussed,  Surgical consent,  Pre-op evaluation,  At surgeon's request and post-op pain management  Laterality: Left and Lower  Prep: chloraprep       Needles:  Injection technique: Single-shot  Needle Type: Echogenic Needle     Needle Length: 9cm  Needle Gauge: 21     Additional Needles:   Procedures:,,,, ultrasound used (permanent image in chart),,    Narrative:  Start time: 04/25/2023 11:57 AM End time: 04/25/2023 12:03 PM Injection made incrementally with aspirations every 5 mL.  Performed by: Personally  Anesthesiologist: Jairo Ben, MD  Additional Notes: Pt identified in Holding room.  Monitors applied. Working IV access confirmed. Timeout Sterile prep L thigh.  #21ga ECHOgenic Arrow block needle into adductor canal with US guidance.  20cc 0.75% Ropivacaine injected incrementally after negative test dose.  Patient asymptomatic, VSS, no heme aspirated, tolerated well.   Sandford Craze, MD

## 2023-04-25 NOTE — Interval H&P Note (Signed)
History and Physical Interval Note:  04/25/2023 10:14 AM  Joanna Castro  has presented today for surgery, with the diagnosis of left knee osteoarthritis.  The various methods of treatment have been discussed with the patient and family. After consideration of risks, benefits and other options for treatment, the patient has consented to  Procedure(s) with comments: LEFT TOTAL KNEE ARTHROPLASTY (Left) - Needs RNFA as a surgical intervention.  The patient's history has been reviewed, patient examined, no change in status, stable for surgery.  I have reviewed the patient's chart and labs.  Questions were answered to the patient's satisfaction.     Eldred Manges

## 2023-04-25 NOTE — Anesthesia Procedure Notes (Signed)
Spinal  Patient location during procedure: OR End time: 04/25/2023 12:55 PM Reason for block: surgical anesthesia Staffing Performed: anesthesiologist  Anesthesiologist: Jairo Ben, MD Performed by: Jairo Ben, MD Authorized by: Jairo Ben, MD   Preanesthetic Checklist Completed: patient identified, IV checked, site marked, risks and benefits discussed, surgical consent, monitors and equipment checked, pre-op evaluation and timeout performed Spinal Block Patient position: sitting Prep: DuraPrep and site prepped and draped Patient monitoring: blood pressure, continuous pulse ox, cardiac monitor and heart rate Approach: midline Location: L3-4 Injection technique: single-shot Needle Needle type: Pencan and Introducer  Needle gauge: 24 G Needle length: 9 cm Assessment Events: CSF return Additional Notes Pt identified in Operating room.  Monitors applied. Working IV access confirmed. Sterile prep, drape lumbar spine.  1% lido local L 3,4.  #24ga Pencan into clear CSF L 3,4.  12mg  0.75% Bupivacaine with dextrose injected with asp CSF beginning and end of injection.  Patient asymptomatic, VSS, no heme aspirated, tolerated well.  Sandford Craze, MD

## 2023-04-25 NOTE — Anesthesia Procedure Notes (Signed)
Date/Time: 04/25/2023 12:40 PM  Performed by: Shary Decamp, CRNAPre-anesthesia Checklist: Patient identified, Emergency Drugs available, Suction available, Timeout performed and Patient being monitored Patient Re-evaluated:Patient Re-evaluated prior to induction Oxygen Delivery Method: Simple face mask

## 2023-04-25 NOTE — Interval H&P Note (Signed)
History and Physical Interval Note:  04/25/2023 12:27 PM  Joanna Castro  has presented today for surgery, with the diagnosis of left knee osteoarthritis.  The various methods of treatment have been discussed with the patient and family. After consideration of risks, benefits and other options for treatment, the patient has consented to  Procedure(s) with comments: LEFT TOTAL KNEE ARTHROPLASTY (Left) - Needs RNFA as a surgical intervention.  The patient's history has been reviewed, patient examined, no change in status, stable for surgery.  I have reviewed the patient's chart and labs.  Questions were answered to the patient's satisfaction.     Eldred Manges

## 2023-04-25 NOTE — Progress Notes (Addendum)
Orthopedic Tech Progress Note Patient Details:  Joanna Castro [birth year] [id number]  PER ORDER MD stated to start tomorrow 0-60.. I called OT JAMES MC to read the order as well and he stated the same thing per the order. CPM is in room set to 0-60. Patient was working with PT when I came to do the CPM   Patient ID: [name], female   DOB: 02-Mar-1946, 77 y.o.   MRN: [id number]  Donald Pore 04/25/2023, 6:12 PM

## 2023-04-25 NOTE — Op Note (Signed)
Pre and postop diagnosis: Left knee primary osteoarthritis  Procedure left total knee arthroplasty  Surgeon: Annell Greening, MD  Assistant: Jeronimo Greaves RNFA  Anesthesia: Spinal plus preoperative adductor block plus Exparel and Marcaine 20+20 prior to closure.  EBL: Less than 150 cc  Tourniquet: 300 x 52 minutes.  Implants:Implants  CEMENT HV SMART SET - [id number]  Inventory Item: CEMENT HV SMART SET Serial no.: Model/Cat no.: [id number]  Implant name: CEMENT HV SMART SET - [id number] Laterality: Left Area: Knee  Manufacturer: [employer] Date of Manufacture:   Action: Implanted Number Used: 1   Device Identifier: Device Identifier Type:   ATTUNE PSRP INSR SZ4 5 KNEE - [id number]  Inventory Item: ATTUNE PSRP INSR SZ4 5 KNEE Serial no.: Model/Cat no.: [id number]  Implant name: ATTUNE PSRP INSR SZ4 5 KNEE - [id number] Laterality: Left Area: Knee  Manufacturer: [employer] Date of Manufacture:   Action: Implanted Number Used: 1   Device Identifier: Device Identifier Type:   PATELLA MEDIAL ATTUN KNEE - [id number]  Inventory Item: PATELLA MEDIAL ATTUN KNEE Serial no.: Model/Cat no.: [id number]  Implant name: PATELLA MEDIAL ATTUN KNEE - [id number] Laterality: Left Area: Knee  Manufacturer: [employer] Date of Manufacture:   Action: Implanted Number Used: 1   Device Identifier: Device Identifier Type:   ATTUNE PS FEM LT SZ 4 CEM KNEE - [id number]  Inventory Item: ATTUNE PS FEM LT SZ 4 CEM KNEE Serial no.: Model/Cat no.: [id number]  Implant name: ATTUNE PS FEM LT SZ 4 CEM KNEE - [id number] Laterality: Left Area: Knee  Manufacturer: [employer] Date of Manufacture:   Action: Implanted Number Used: 1   Device Identifier: Device Identifier Type:   BASEPLATE TIBIAL ROTATING SZ 4 - [id number]  Inventory Item: BASEPLATE TIBIAL ROTATING SZ 4 Serial no.: Model/Cat no.: [id number]  Implant name: BASEPLATE TIBIAL ROTATING SZ 4 - [id number] Laterality:  Left Area: Knee  Manufacturer: [employer] Date of Manufacture:   Action: Implanted Number Used: 1   Device Identifier: Device Identifier Type:     Procedure patient with progressive left knee osteoarthritis failed conservative treatment including injections anti-inflammatories.  Past history of total knee arthroplasty greater than 10 years ago by me opposite knee doing well.  Patient underwent adductor block spinal anesthesia preoperative Ancef prophylaxis with heel bump lateral post proximal thigh tourniquet and DuraPrep.  Timeout procedure was completed after draping impervious stockinette Coban and sterile skin marker with Betadine Steri-Drape sealing the skin.  Leg was wrapped in Esmarch tourniquet inflated.  Midline incision was made medial parapatellar incision patella everted and 10 mm resected off the patella.  Intramedullary hole made in the femur 10 mm resected off the femur 10 off the tibia and a 5 spacer block allowed the knee to come to full extension.  Marginal osteophytes were removed there is tricompartmental degenerative arthritis.  Chamfer cuts were made on the femur box cut keel preparation on the tibia and femur was sized for the tibia was sized for 5 mm spacer rotating platform DePuy and 35 mm 3 peg patella.  Lug nuts were drilled on the trial for the femur.  Pulsatile lavage vacuum mixing of the cement cementing of the tibia followed by femur placing the permanent poly and then the patellar component held with patellar clamp.  All excessive cement had been removed knee reach full extension all meniscal remnants have been removed there were no posterior spurs and needed to come off the femur.  Marcaine and Exparel were injected and subtenons  tissue skin capsule while cement was setting up.  Cement was hardened 15 minutes tourniquet deflated hemostasis obtained then stayed GERD layered closure with #1 Vicryl in the split patellar tendon between the medial one third lateral two  thirds and medial capsule.  2 on subtenons tissue skin staple closure Mepilex 4 x 4's ABD web roll and Ace wrap followed by knee immobilizer with ice machine.  Instrument needle count was correct.

## 2023-04-25 NOTE — Anesthesia Postprocedure Evaluation (Signed)
Anesthesia Post Note  Patient: Joanna Castro  Procedure(s) Performed: LEFT TOTAL KNEE ARTHROPLASTY (Left: Knee)     Patient location during evaluation: PACU Anesthesia Type: Spinal Level of consciousness: awake and alert, oriented and patient cooperative Pain management: pain level controlled Vital Signs Assessment: post-procedure vital signs reviewed and stable Respiratory status: spontaneous breathing, nonlabored ventilation and respiratory function stable Cardiovascular status: blood pressure returned to baseline and stable Postop Assessment: no apparent nausea or vomiting and spinal receding Anesthetic complications: no   No notable events documented.  Last Vitals:  Vitals:   04/25/23 1545 04/25/23 1610  BP: 138/83 (!) 150/78  Pulse: 75 78  Resp: 17   Temp: 36.5 C 36.7 C  SpO2: 96% 100%    Last Pain:  Vitals:   04/25/23 1530  TempSrc:   PainSc: 0-No pain                 Amarri Michaelson,E. Cayci Mcnabb

## 2023-04-25 NOTE — Interval H&P Note (Signed)
History and Physical Interval Note:  04/25/2023 10:13 AM  Joanna Castro  has presented today for surgery, with the diagnosis of left knee osteoarthritis.  The various methods of treatment have been discussed with the patient and family. After consideration of risks, benefits and other options for treatment, the patient has consented to  Procedure(s) with comments: LEFT TOTAL KNEE ARTHROPLASTY (Left) - Needs RNFA as a surgical intervention.  The patient's history has been reviewed, patient examined, no change in status, stable for surgery.  I have reviewed the patient's chart and labs.  Questions were answered to the patient's satisfaction.     Eldred Manges  Planned TKA left as discussed with her in April visit. All questions answered.

## 2023-04-25 NOTE — Evaluation (Signed)
Physical Therapy Evaluation Patient Details Name: Joanna Castro MRN: [id number] DOB: 1946/09/23 Today's Date: 04/25/2023  History of Present Illness  77 y.o. female presents to [facility] hospital on 04/25/2023 for elective L TKA. PMH includes fibromyalgia, glaucoma, HLD, HTN, scoliosis, OSA.  Clinical Impression  Pt presents to PT with deficits in functional mobility, gait, balance, strength, power, ROM. Pt is mobilizing well, ambulating with RW for short distances. Pt benefits from verbal cues to improve transfer technique at this time. PT provides education on TKR exercise packet. Pt will benefit from further gait and transfer training tomorrow.       Recommendations for follow up therapy are one component of a multi-disciplinary discharge planning process, led by the attending physician.  Recommendations may be updated based on patient status, additional functional criteria and insurance authorization.  Follow Up Recommendations       Assistance Recommended at Discharge PRN  Patient can return home with the following  A little help with bathing/dressing/bathroom;Assistance with cooking/housework;Assist for transportation;Help with stairs or ramp for entrance    Equipment Recommendations None recommended by PT  Recommendations for Other Services       Functional Status Assessment Patient has had a recent decline in their functional status and demonstrates the ability to make significant improvements in function in a reasonable and predictable amount of time.     Precautions / Restrictions Precautions Precautions: Fall Restrictions Weight Bearing Restrictions: Yes LLE Weight Bearing: Weight bearing as tolerated      Mobility  Bed Mobility Overal bed mobility: Needs Assistance Bed Mobility: Supine to Sit, Sit to Supine     Supine to sit: Supervision, HOB elevated Sit to supine: Supervision        Transfers Overall transfer level: Needs assistance Equipment used: Rolling  walker (2 wheels) Transfers: Sit to/from Stand Sit to Stand: Min guard, Min assist           General transfer comment: minA from bed, minG from commode with grab bar    Ambulation/Gait Ambulation/Gait assistance: Min guard Gait Distance (Feet): 40 Feet Assistive device: Rolling walker (2 wheels) Gait Pattern/deviations: Step-through pattern, Decreased stride length Gait velocity: reduced Gait velocity interpretation: <1.31 ft/sec, indicative of household ambulator   General Gait Details: pt with slowed step-through gait  Stairs            Wheelchair Mobility    Modified Rankin (Stroke Patients Only)       Balance Overall balance assessment: Needs assistance Sitting-balance support: No upper extremity supported, Feet supported Sitting balance-Leahy Scale: Good     Standing balance support: Single extremity supported, Bilateral upper extremity supported, Reliant on assistive device for balance Standing balance-Leahy Scale: Poor                               Pertinent Vitals/Pain Pain Assessment Pain Assessment: 0-10 Pain Score: 10-Worst pain ever Pain Location: L knee Pain Descriptors / Indicators: Aching Pain Intervention(s): RN gave pain meds during session    Home Living Family/patient expects to be discharged to:: Private residence Living Arrangements: Alone Available Help at Discharge: Family;Available 24 hours/day Type of Home: House Home Access: Ramped entrance       Home Layout: One level;Laundry or work area in [employer] Equipment: Agricultural consultant (2 wheels);Cane - single point;Shower seat - built in;BSC/3in1      Prior Function Prior Level of Function : Independent/Modified Independent;Driving  Mobility Comments: ambulatory with SPC       Hand Dominance        Extremity/Trunk Assessment   Upper Extremity Assessment Upper Extremity Assessment: Overall WFL for tasks assessed    Lower Extremity  Assessment Lower Extremity Assessment: LLE deficits/detail LLE Deficits / Details: post-op knee ROM deficits as expected s/p TKA, able to perform SLR with mild lag    Cervical / Trunk Assessment Cervical / Trunk Assessment: Normal  Communication   Communication: No difficulties  Cognition Arousal/Alertness: Awake/alert Behavior During Therapy: WFL for tasks assessed/performed Overall Cognitive Status: Within Functional Limits for tasks assessed                                          General Comments General comments (skin integrity, edema, etc.): VSS on RA    Exercises Other Exercises Other Exercises: PT provides education on TKR exercise packet   Assessment/Plan    PT Assessment Patient needs continued PT services  PT Problem List Decreased strength;Decreased range of motion;Decreased activity tolerance;Decreased balance;Decreased mobility;Decreased knowledge of use of DME;Pain       PT Treatment Interventions DME instruction;Gait training;Functional mobility training;Therapeutic exercise;Therapeutic activities;Balance training;Neuromuscular re-education;Patient/family education    PT Goals (Current goals can be found in the Care Plan section)  Acute Rehab PT Goals Patient Stated Goal: to return to independence PT Goal Formulation: With patient Time For Goal Achievement: 04/29/23 Potential to Achieve Goals: Good    Frequency 7X/week     Co-evaluation               AM-PAC PT "6 Clicks" Mobility  Outcome Measure Help needed turning from your back to your side while in a flat bed without using bedrails?: A Little Help needed moving from lying on your back to sitting on the side of a flat bed without using bedrails?: A Little Help needed moving to and from a bed to a chair (including a wheelchair)?: A Little Help needed standing up from a chair using your arms (e.g., wheelchair or bedside chair)?: A Little Help needed to walk in hospital room?:  A Little Help needed climbing 3-5 steps with a railing? : Total 6 Click Score: 16    End of Session Equipment Utilized During Treatment: Gait belt Activity Tolerance: Patient tolerated treatment well Patient left: in bed;with bed alarm set;with call bell/phone within reach;with family/visitor present Nurse Communication: Mobility status PT Visit Diagnosis: Other abnormalities of gait and mobility (R26.89);Muscle weakness (generalized) (M62.81);Pain Pain - Right/Left: Left Pain - part of body: Knee    Time: 1710-1737 PT Time Calculation (min) (ACUTE ONLY): 27 min   Charges:   PT Evaluation $PT Eval Low Complexity: 1 Low          Arlyss Gandy, PT, DPT Acute Rehabilitation Office [phone]   Arlyss Gandy 04/25/2023, 5:47 PM

## 2023-04-26 ENCOUNTER — Encounter (HOSPITAL_COMMUNITY): Payer: Self-pay | Admitting: Orthopaedic Surgery

## 2023-04-26 DIAGNOSIS — M1712 Unilateral primary osteoarthritis, left knee: Secondary | ICD-10-CM | POA: Diagnosis not present

## 2023-04-26 LAB — CBC
HCT: 30.2 % — ABNORMAL LOW (ref 36.0–46.0)
Hemoglobin: 10 g/dL — ABNORMAL LOW (ref 12.0–15.0)
MCH: 29.7 pg (ref 26.0–34.0)
MCHC: 33.1 g/dL (ref 30.0–36.0)
MCV: 89.6 fL (ref 80.0–100.0)
Platelets: 197 10*3/uL (ref 150–400)
RBC: 3.37 MIL/uL — ABNORMAL LOW (ref 3.87–5.11)
RDW: 12.7 % (ref 11.5–15.5)
WBC: 8.8 10*3/uL (ref 4.0–10.5)
nRBC: 0 % (ref 0.0–0.2)

## 2023-04-26 LAB — BASIC METABOLIC PANEL
Anion gap: 13 (ref 5–15)
BUN: 15 mg/dL (ref 8–23)
CO2: 21 mmol/L — ABNORMAL LOW (ref 22–32)
Calcium: 8.7 mg/dL — ABNORMAL LOW (ref 8.9–10.3)
Chloride: 105 mmol/L (ref 98–111)
Creatinine, Ser: 0.95 mg/dL (ref 0.44–1.00)
GFR, Estimated: >60 mL/min (ref >60)
Glucose, Bld: 181 mg/dL — ABNORMAL HIGH (ref 70–99)
Potassium: 4.1 mmol/L (ref 3.5–5.1)
Sodium: 139 mmol/L (ref 135–145)

## 2023-04-26 NOTE — Progress Notes (Signed)
Physical Therapy Treatment Patient Details Name: Joanna Castro MRN: [id number] DOB: 1946/05/09 Today's Date: 04/26/2023   History of Present Illness 77 y.o. female presents to [facility] hospital on 04/25/2023 for elective L TKA. PMH includes fibromyalgia, glaucoma, HLD, HTN, scoliosis, OSA.    PT Comments    Continuing work on functional mobility and activity tolerance;  session focused on therapeutic exercise with good progress of knee flexion with pt providing her own active assisted ROM; REports had gotten up to the bathroom twoce since earlier PT session; On track for dc home tomorrow   Recommendations for follow up therapy are one component of a multi-disciplinary discharge planning process, led by the attending physician.  Recommendations may be updated based on patient status, additional functional criteria and insurance authorization.  Follow Up Recommendations       Assistance Recommended at Discharge PRN  Patient can return home with the following A little help with bathing/dressing/bathroom;Assistance with cooking/housework;Assist for transportation;Help with stairs or ramp for entrance   Equipment Recommendations  None recommended by PT    Recommendations for Other Services       Precautions / Restrictions Precautions Precautions: Fall;Knee Precaution Booklet Issued: Yes (comment) Restrictions LLE Weight Bearing: Weight bearing as tolerated     Mobility  Bed Mobility Overal bed mobility: Needs Assistance Bed Mobility: Supine to Sit     Supine to sit: Supervision     General bed mobility comments: Supervision and occasional cues for more efficient movement; bed flat to approximate home    Transfers Overall transfer level: Needs assistance Equipment used: Rolling walker (2 wheels) Transfers: Sit to/from Stand Sit to Stand: Min assist           General transfer comment: Min assist to steady and power up; tended to try to pull up on RW; cues for hand  placement    Ambulation/Gait Ambulation/Gait assistance: Min guard Gait Distance (Feet): 75 Feet Assistive device: Rolling walker (2 wheels) Gait Pattern/deviations: Step-through pattern, Decreased stride length       General Gait Details: Cues and monitor for stable L knee in stance; no buckling, folding, or sinking into flexion; initially with difficulty clearing L foot for advancement, improved with practice; tends to stp walking when she talks   Stairs             Wheelchair Mobility    Modified Rankin (Stroke Patients Only)       Balance     Sitting balance-Leahy Scale: Good       Standing balance-Leahy Scale: Fair                              Cognition Arousal/Alertness: Awake/alert Behavior During Therapy: WFL for tasks assessed/performed Overall Cognitive Status: Within Functional Limits for tasks assessed                                          Exercises Total Joint Exercises Quad Sets: AROM, Left, 10 reps Short Arc Quad: AROM, Left, 10 reps Heel Slides: AROM, AAROM, 10 reps, Left Straight Leg Raises: AAROM, Left, 10 reps    General Comments General comments (skin integrity, edema, etc.): Pt's daughter present and suportive      Pertinent Vitals/Pain Pain Assessment Pain Assessment: 0-10 Pain Score: 6  Faces Pain Scale:  (di dnot note tears) Pain Location: L knee Pain  Descriptors / Indicators: Aching, Tender, Throbbing Pain Intervention(s): Monitored during session    Home Living                          Prior Function            PT Goals (current goals can now be found in the care plan section) Acute Rehab PT Goals Patient Stated Goal: to return to independence PT Goal Formulation: With patient Time For Goal Achievement: 04/29/23 Potential to Achieve Goals: Good Progress towards PT goals: Progressing toward goals    Frequency    7X/week      PT Plan Current plan remains  appropriate    Co-evaluation              AM-PAC PT "6 Clicks" Mobility   Outcome Measure  Help needed turning from your back to your side while in a flat bed without using bedrails?: A Little Help needed moving from lying on your back to sitting on the side of a flat bed without using bedrails?: A Little Help needed moving to and from a bed to a chair (including a wheelchair)?: A Little Help needed standing up from a chair using your arms (e.g., wheelchair or bedside chair)?: A Little Help needed to walk in hospital room?: A Little Help needed climbing 3-5 steps with a railing? : A Little 6 Click Score: 18    End of Session Equipment Utilized During Treatment: Gait belt Activity Tolerance: Patient tolerated treatment well Patient left: in bed;with call bell/phone within reach;with family/visitor present Nurse Communication: Mobility status PT Visit Diagnosis: Other abnormalities of gait and mobility (R26.89);Muscle weakness (generalized) (M62.81);Pain Pain - Right/Left: Left Pain - part of body: Knee     Time: 1610-9604 PT Time Calculation (min) (ACUTE ONLY): 28 min  Charges:  $Therapeutic Exercise: 23-37 mins                     Van Clines, PT  Acute Rehabilitation Services Office [phone] Secure Chat welcomed    Levi Aland 04/26/2023, 6:27 PM

## 2023-04-26 NOTE — Care Management Obs Status (Signed)
MEDICARE OBSERVATION STATUS NOTIFICATION   Patient Details  Name: Joanna Castro MRN: [id number] Date of Birth: 12-04-1945   Medicare Observation Status Notification Given:  Yes    Lawerance Sabal, RN 04/26/2023, 3:05 PM

## 2023-04-26 NOTE — Evaluation (Signed)
Occupational Therapy Evaluation Patient Details Name: Joanna Castro MRN: [id number] DOB: 1945/12/19 Today's Date: 04/26/2023   History of Present Illness 77 y.o. female presents to [facility] hospital on 04/25/2023 for elective L TKA. PMH includes fibromyalgia, glaucoma, HLD, HTN, scoliosis, OSA.   Clinical Impression   Patient admitted for the procedure above.  PTA she lives alone, uses a RW for mobility and is Mod I for ADL completion with increased time and uses of a reacher and long handled sponge.  Patient express post op discomfort impacting ADL independence, but will have her daughter staying with her for a few days, and she should regain her Mod I level.  No significant OT needs exist in the acute setting, she has the needed DME for ADL completion at home, and no post acute OT is anticipated.        Recommendations for follow up therapy are one component of a multi-disciplinary discharge planning process, led by the attending physician.  Recommendations may be updated based on patient status, additional functional criteria and insurance authorization.   Assistance Recommended at Discharge Intermittent Supervision/Assistance  Patient can return home with the following Assist for transportation;Assistance with cooking/housework;A little help with bathing/dressing/bathroom    Functional Status Assessment  Patient has had a recent decline in their functional status and demonstrates the ability to make significant improvements in function in a reasonable and predictable amount of time.  Equipment Recommendations  None recommended by OT    Recommendations for Other Services       Precautions / Restrictions Precautions Precautions: Fall Restrictions Weight Bearing Restrictions: Yes LLE Weight Bearing: Weight bearing as tolerated      Mobility Bed Mobility               General bed mobility comments: up in recliner    Transfers Overall transfer level: Needs  assistance Equipment used: Rolling walker (2 wheels) Transfers: Sit to/from Stand, Bed to chair/wheelchair/BSC Sit to Stand: Min guard     Step pivot transfers: Supervision            Balance Overall balance assessment: Needs assistance Sitting-balance support: No upper extremity supported, Feet supported Sitting balance-Leahy Scale: Good     Standing balance support: Reliant on assistive device for balance Standing balance-Leahy Scale: Fair                             ADL either performed or assessed with clinical judgement   ADL                       Lower Body Dressing: Minimal assistance;Sit to/from stand   Toilet Transfer: Supervision/safety;Rolling walker (2 wheels);Regular Toilet                   Vision Patient Visual Report: No change from baseline       Perception     Praxis      Pertinent Vitals/Pain Pain Assessment Pain Assessment: Faces Faces Pain Scale: Hurts even more Pain Location: L knee Pain Descriptors / Indicators: Aching, Tender, Throbbing Pain Intervention(s): Monitored during session     Hand Dominance     Extremity/Trunk Assessment Upper Extremity Assessment Upper Extremity Assessment: Overall WFL for tasks assessed   Lower Extremity Assessment Lower Extremity Assessment: Defer to PT evaluation   Cervical / Trunk Assessment Cervical / Trunk Assessment: Normal   Communication Communication Communication: No difficulties   Cognition Arousal/Alertness: Awake/alert Behavior During  Therapy: WFL for tasks assessed/performed Overall Cognitive Status: Within Functional Limits for tasks assessed                                       General Comments   VSS on RA    Exercises     Shoulder Instructions      Home Living Family/patient expects to be discharged to:: Private residence Living Arrangements: Alone Available Help at Discharge: Family;Available 24 hours/day Type of Home:  House Home Access: Ramped entrance     Home Layout: One level;Laundry or work area in basement     [employer] Shower/Tub: Producer, television/film/video: Handicapped height Bathroom Accessibility: Yes   Home Equipment: Agricultural consultant (2 wheels);Cane - single point;Shower seat - built in;BSC/3in1          Prior Functioning/Environment Prior Level of Function : Independent/Modified Independent;Driving             Mobility Comments: ambulatory with SPC ADLs Comments: no assist with ADL and iADL        OT Problem List: Pain;Decreased activity tolerance;Impaired balance (sitting and/or standing)      OT Treatment/Interventions:      OT Goals(Current goals can be found in the care plan section) Acute Rehab OT Goals Patient Stated Goal: Return home OT Goal Formulation: With patient Potential to Achieve Goals: Good  OT Frequency:      Co-evaluation              AM-PAC OT "6 Clicks" Daily Activity     Outcome Measure Help from another person eating meals?: None Help from another person taking care of personal grooming?: None Help from another person toileting, which includes using toliet, bedpan, or urinal?: A Little Help from another person bathing (including washing, rinsing, drying)?: A Little Help from another person to put on and taking off regular upper body clothing?: None Help from another person to put on and taking off regular lower body clothing?: A Little 6 Click Score: 21   End of Session Equipment Utilized During Treatment: Rolling walker (2 wheels) Nurse Communication: Mobility status  Activity Tolerance: Patient tolerated treatment well Patient left: in chair;with call bell/phone within reach  OT Visit Diagnosis: Unsteadiness on feet (R26.81);Pain Pain - Right/Left: Left Pain - part of body: Knee                Time: 1035-1057 OT Time Calculation (min): 22 min Charges:  OT General Charges $OT Visit: 1 Visit OT Evaluation $OT Eval  Moderate Complexity: 1 Mod  04/26/2023  RP, OTR/L  Acute Rehabilitation Services  Office:  [phone]   Suzanna Obey 04/26/2023, 11:00 AM

## 2023-04-26 NOTE — Progress Notes (Signed)
Physical Therapy Treatment Patient Details Name: Joanna Castro MRN: [id number] DOB: Dec 12, 1945 Today's Date: 04/26/2023   History of Present Illness 77 y.o. female presents to [facility] hospital on 04/25/2023 for elective L TKA. PMH includes fibromyalgia, glaucoma, HLD, HTN, scoliosis, OSA.    PT Comments    Continuing work on functional mobility and activity tolerance;  Session focused on transfers and progressive amb, with good progress towards goals; noting difficulty with sit to stand from the bed (at lowest height to approximate home); Pt needed physical assist to steady and rise; Her assist at home will not be consistently available until tomorrow near noon   Recommendations for follow up therapy are one component of a multi-disciplinary discharge planning process, led by the attending physician.  Recommendations may be updated based on patient status, additional functional criteria and insurance authorization.  Follow Up Recommendations       Assistance Recommended at Discharge PRN  Patient can return home with the following A little help with bathing/dressing/bathroom;Assistance with cooking/housework;Assist for transportation;Help with stairs or ramp for entrance   Equipment Recommendations  None recommended by PT    Recommendations for Other Services       Precautions / Restrictions Precautions Precautions: Fall;Knee Precaution Booklet Issued: Yes (comment) Restrictions Weight Bearing Restrictions: Yes LLE Weight Bearing: Weight bearing as tolerated     Mobility  Bed Mobility Overal bed mobility: Needs Assistance Bed Mobility: Supine to Sit     Supine to sit: Supervision     General bed mobility comments: Supervision and occasional cues for more efficient movement; bed flat to approximate home    Transfers Overall transfer level: Needs assistance Equipment used: Rolling walker (2 wheels) Transfers: Sit to/from Stand Sit to Stand: Min assist            General transfer comment: Min assist to steady and power up; tended to try to pull up on RW; cues for hand placement    Ambulation/Gait Ambulation/Gait assistance: Min guard Gait Distance (Feet): 75 Feet Assistive device: Rolling walker (2 wheels) Gait Pattern/deviations: Step-through pattern, Decreased stride length       General Gait Details: Cues and monitor for stable L knee in stance; no buckling, folding, or sinking into flexion; initially with difficulty clearing L foot for advancement, improved with practice; tends to stp walking when she talks   Stairs             Wheelchair Mobility    Modified Rankin (Stroke Patients Only)       Balance     Sitting balance-Leahy Scale: Good       Standing balance-Leahy Scale: Fair                              Cognition Arousal/Alertness: Awake/alert Behavior During Therapy: WFL for tasks assessed/performed Overall Cognitive Status: Within Functional Limits for tasks assessed                                          Exercises      General Comments General comments (skin integrity, edema, etc.): Discussed assist at home; Reports she has a chair lift -- instructed pt not to use the lift feature of teh recliner      Pertinent Vitals/Pain Pain Assessment Pain Assessment: 0-10 Pain Score: 10-Worst pain ever Faces Pain Scale:  (di dnot note tears)  Pain Location: L knee Pain Descriptors / Indicators: Aching, Tender, Throbbing Pain Intervention(s): Monitored during session    Home Living Family/patient expects to be discharged to:: Private residence Living Arrangements: Alone Available Help at Discharge: Family;Available 24 hours/day Type of Home: House Home Access: Ramped entrance       Home Layout: One level;Laundry or work area in [employer] Equipment: Agricultural consultant (2 wheels);Cane - single point;Shower seat - built in;BSC/3in1      Prior Function            PT  Goals (current goals can now be found in the care plan section) Acute Rehab PT Goals Patient Stated Goal: to return to independence PT Goal Formulation: With patient Time For Goal Achievement: 04/29/23 Potential to Achieve Goals: Good Progress towards PT goals: Progressing toward goals    Frequency    7X/week      PT Plan Current plan remains appropriate    Co-evaluation              AM-PAC PT "6 Clicks" Mobility   Outcome Measure  Help needed turning from your back to your side while in a flat bed without using bedrails?: A Little Help needed moving from lying on your back to sitting on the side of a flat bed without using bedrails?: A Little Help needed moving to and from a bed to a chair (including a wheelchair)?: A Little Help needed standing up from a chair using your arms (e.g., wheelchair or bedside chair)?: A Little Help needed to walk in hospital room?: A Little Help needed climbing 3-5 steps with a railing? : Total 6 Click Score: 16    End of Session Equipment Utilized During Treatment: Gait belt Activity Tolerance: Patient tolerated treatment well Patient left: in chair;with call bell/phone within reach;with chair alarm set;with nursing/sitter in room Nurse Communication: Mobility status PT Visit Diagnosis: Other abnormalities of gait and mobility (R26.89);Muscle weakness (generalized) (M62.81);Pain Pain - Right/Left: Left Pain - part of body: Knee     Time: 0933-1000 PT Time Calculation (min) (ACUTE ONLY): 27 min  Charges:  $Gait Training: 8-22 mins $Therapeutic Activity: 8-22 mins                     Van Clines, PT  Acute Rehabilitation Services Office [phone] Secure Chat welcomed    Joanna Castro 04/26/2023, 11:38 AM

## 2023-04-26 NOTE — Progress Notes (Signed)
Patient ID: Joanna Castro, female   DOB: 1946-04-10, 77 y.o.   MRN: [id number]   Subjective: 1 Day Post-Op Procedure(s) (LRB): LEFT TOTAL KNEE ARTHROPLASTY (Left) Patient reports pain as moderate.    Objective: Vital signs in last 24 hours: Temp:  [97.5 F (36.4 C)-98.3 F (36.8 C)] 97.5 F (36.4 C) (06/18 0853) Pulse Rate:  [75-88] 88 (06/18 0853) Resp:  [16-18] 18 (06/18 0853) BP: (117-150)/(55-83) 130/63 (06/18 0853) SpO2:  [96 %-100 %] 98 % (06/18 0853)  Intake/Output from previous day: 06/17 0701 - 06/18 0700 In: 2336.2 [I.V.:2136.2; IV Piggyback:200] Out: 100 [Blood:100] Intake/Output this shift: Total I/O In: -  Out: 250 [Urine:250]  Recent Labs    04/26/23 0102  HGB 10.0*   Recent Labs    04/26/23 0102  WBC 8.8  RBC 3.37*  HCT 30.2*  PLT 197   Recent Labs    04/26/23 0102  NA 139  K 4.1  CL 105  CO2 21*  BUN 15  CREATININE 0.95  GLUCOSE 181*  CALCIUM 8.7*   No results for input(s): "LABPT", "INR" in the last 72 hours.  Neurologically intact No results found.  Assessment/Plan: 1 Day Post-Op Procedure(s) (LRB): LEFT TOTAL KNEE ARTHROPLASTY (Left) Up with therapy, still needs assistance to get up and not ready for discharge home today . Likely home tomorrow after therapy.   Eldred Manges 04/26/2023, 3:31 PM

## 2023-04-26 NOTE — TOC Initial Note (Signed)
Transition of Care [facility]) - Initial/Assessment Note    Patient Details  Name: Joanna Castro MRN: [id number] Date of Birth: [birth year]  [facility] CM/SW Contact:    Lawerance Sabal, RN Phone Number: 04/26/2023, 2:33 PM  Clinical Narrative:                  Sherron Monday w patient at bedside with daughter.  Plan is for DC to home, likely tomorrow.  She lives in a handicap accessible single level home.  She has RW WC 3/1 and no preference for [facility] agency.  [facility] able to accept.    Expected Discharge Plan: Home w Home Health Services Barriers to Discharge: Continued Medical Work up   Patient Goals and CMS Choice Patient states their goals for this hospitalization and ongoing recovery are:: to go home          Expected Discharge Plan and Services   Discharge Planning Services: CM Consult Post Acute Care Choice: Home Health Living arrangements for the past 2 months: Single Family Home                 DME Arranged: N/A         HH Arranged: OT, PT HH Agency: [facility] Date [facility] Agency Contacted: 04/26/23 Time HH Agency Contacted: 1432 Representative spoke with at [facility] Agency: Kandee Keen  Prior Living Arrangements/Services Living arrangements for the past 2 months: Single Family Home Lives with:: Spouse   Do you feel safe going back to the place where you live?: Yes          Current home services: Home PT, Home OT    Activities of Daily Living Home Assistive Devices/Equipment: Cane (specify quad or straight), Shower chair with back, Wheelchair, Eyeglasses ADL Screening (condition at time of admission) Patient's cognitive ability adequate to safely complete daily activities?: Yes Is the patient deaf or have difficulty hearing?: Yes Does the patient have difficulty seeing, even when wearing glasses/contacts?: No Does the patient have difficulty concentrating, remembering, or making decisions?: No Patient able to express need for assistance with ADLs?:  Yes Does the patient have difficulty dressing or bathing?: No Independently performs ADLs?: Yes (appropriate for developmental age) Does the patient have difficulty walking or climbing stairs?: Yes Weakness of Legs: Left Weakness of Arms/Hands: None  Permission Sought/Granted                  Emotional Assessment              Admission diagnosis:  S/P total knee arthroplasty, left [Z96.652] Patient Active Problem List   Diagnosis Date Noted   S/P total knee arthroplasty, left 04/25/2023   Unilateral primary osteoarthritis, left knee 03/12/2023   S/P lumbar fusion 07/18/2017   Postoperative urinary retention 06/03/2017   Lumbar stenosis with neurogenic claudication 05/30/2017   PCP:  Charlette Caffey, MD Pharmacy:   [id number] - [place], [place] - [address] [place] [address] Phone: [phone] Fax: [phone]  [facility] - [place], [place] - [address] [place] [address] Phone: [phone] Fax: [phone]     Social Determinants of Health (SDOH) Social History: SDOH Screenings   Food Insecurity: No Food Insecurity (04/25/2023)  Housing: Low Risk  (04/25/2023)  Transportation Needs: No Transportation Needs (04/25/2023)  Utilities: Not At Risk (04/25/2023)  Tobacco Use: Low Risk  (04/25/2023)   SDOH Interventions:  Readmission Risk Interventions     No data to display

## 2023-04-26 NOTE — Discharge Instructions (Signed)

## 2023-04-27 DIAGNOSIS — M1712 Unilateral primary osteoarthritis, left knee: Secondary | ICD-10-CM | POA: Diagnosis not present

## 2023-04-27 LAB — CBC
HCT: 31.4 % — ABNORMAL LOW (ref 36.0–46.0)
Hemoglobin: 10.7 g/dL — ABNORMAL LOW (ref 12.0–15.0)
MCH: 31.3 pg (ref 26.0–34.0)
MCHC: 34.1 g/dL (ref 30.0–36.0)
MCV: 91.8 fL (ref 80.0–100.0)
Platelets: 207 10*3/uL (ref 150–400)
RBC: 3.42 MIL/uL — ABNORMAL LOW (ref 3.87–5.11)
RDW: 13.2 % (ref 11.5–15.5)
WBC: 10.1 10*3/uL (ref 4.0–10.5)
nRBC: 0 % (ref 0.0–0.2)

## 2023-04-27 MED ORDER — OXYCODONE-ACETAMINOPHEN 5-325 MG PO TABS: 1.0000 | ORAL_TABLET | Freq: Four times a day (QID) | ORAL | 0 refills | Status: AC | PRN

## 2023-04-27 MED ORDER — METHOCARBAMOL 500 MG PO TABS: 500.0000 mg | ORAL_TABLET | Freq: Three times a day (TID) | ORAL | 1 refills | Status: AC | PRN

## 2023-04-27 NOTE — Discharge Summary (Signed)
Physician Discharge Summary  Patient ID: Joanna Castro MRN: [id number] DOB/AGE: 78-Aug-1947 78 y.o.  Admit date: 04/25/2023 Discharge date: 04/27/2023  Admission Diagnoses: Left knee primary osteoarthritis  Discharge Diagnoses:  Same Principal Problem:   S/P total knee arthroplasty, left   Discharged Condition: good  Hospital Course: Patient had previous right total knee arthroplasty  2015 doing well.  Progressive left knee osteoarthritis bone-on-bone changes failed conservative treatment.  Patient was admitted for left total knee arthroplasty.  She did well physical therapy had some fatigue with therapy and needed 1+ assistance in order to get up to go to the bathroom after postop day 1 stayed 1 extra night for additional therapy and was independent ambulating back-and-forth to the bathroom up and down the hall and can ambulate 1-2 stairs steps without problems.  Patient has a ramp at home.  Home health physical therapy arranged twelve 1 week.  Consults: PT, OT.  Care management services for home health needs.  Significant Diagnostic Studies: Routine admission labs showed hemoglobin of 13.4 preop and 10.7 postop.  Treatments: Surgery as listed above.  Discharge Exam: Blood pressure 131/73, pulse 98, temperature 98.5 F (36.9 C), temperature source Oral, resp. rate 16, height 5\' 5"  (1.651 m), weight 90.7 kg, SpO2 100 %. Physical exam: Knee dressing was changed and incision looks good at time of discharge.  New Mepilex white dressing was applied.  Disposition: Discharge disposition: 01-Home or Self Care        Allergies as of 04/27/2023       Reactions   Chlorhexidine Hives, Rash        Medication List     STOP taking these medications    acetaminophen 500 MG tablet Commonly known as: TYLENOL   diclofenac 50 MG tablet Commonly known as: CATAFLAM   traMADol 50 MG tablet Commonly known as: ULTRAM       TAKE these medications    allopurinol 100 MG  tablet Commonly known as: ZYLOPRIM Take 100 mg by mouth 2 (two) times daily.   amLODipine 10 MG tablet Commonly known as: NORVASC Take 10 mg by mouth at bedtime.   CALCIUM CITRATE-VITAMIN D PO Take 2 tablets by mouth daily.   cetirizine 10 MG tablet Commonly known as: ZYRTEC Take 10 mg by mouth daily.   furosemide 20 MG tablet Commonly known as: LASIX Take 20 mg by mouth daily.   gabapentin 300 MG capsule Commonly known as: NEURONTIN Take 600 mg by mouth 2 (two) times daily.   MAGNESIUM PO Take 400 mg by mouth daily.   methocarbamol 500 MG tablet Commonly known as: ROBAXIN Take 1 tablet (500 mg total) by mouth every 8 (eight) hours as needed for muscle spasms.   multivitamin with minerals tablet Take 1 tablet by mouth daily.   oxyCODONE-acetaminophen 5-325 MG tablet Commonly known as: Percocet Take 1-2 tablets by mouth every 6 (six) hours as needed for severe pain.   potassium chloride 10 MEQ tablet Commonly known as: KLOR-CON M Take 10 mEq by mouth daily.   pravastatin 40 MG tablet Commonly known as: PRAVACHOL Take 40 mg by mouth at bedtime.   raloxifene 60 MG tablet Commonly known as: EVISTA Take 60 mg by mouth daily.   timolol 0.5 % ophthalmic solution Commonly known as: TIMOPTIC Place 1 drop into both eyes 2 (two) times daily.   Vitamin D3 25 MCG (1000 UT) Caps Take 1,000 Units by mouth daily.        Follow-up Information  Care, [facility] Follow up.   Specialty: Home Health Services Why: for home health services Contact information: [address] [place] [address] [phone]                 Signed: Eldred Manges 04/27/2023, 3:29 PM

## 2023-04-27 NOTE — Progress Notes (Signed)
Patient ID: ANGELYNE CONKLING, female   DOB: 1946-01-22, 77 y.o.   MRN: [id number]   Subjective: 2 Days Post-Op Procedure(s) (LRB): LEFT TOTAL KNEE ARTHROPLASTY (Left) Patient reports pain as moderate.    Objective: Vital signs in last 24 hours: Temp:  [97.5 F (36.4 C)-98.5 F (36.9 C)] 98.5 F (36.9 C) (06/19 0748) Pulse Rate:  [87-104] 98 (06/19 0748) Resp:  [16-18] 16 (06/19 0748) BP: (130-141)/(63-73) 131/73 (06/19 0748) SpO2:  [98 %-100 %] 100 % (06/19 0748)  Intake/Output from previous day: 06/18 0701 - 06/19 0700 In: -  Out: 750 [Urine:750] Intake/Output this shift: No intake/output data recorded.  Recent Labs    04/26/23 0102 04/27/23 0141  HGB 10.0* 10.7*   Recent Labs    04/26/23 0102 04/27/23 0141  WBC 8.8 10.1  RBC 3.37* 3.42*  HCT 30.2* 31.4*  PLT 197 207   Recent Labs    04/26/23 0102  NA 139  K 4.1  CL 105  CO2 21*  BUN 15  CREATININE 0.95  GLUCOSE 181*  CALCIUM 8.7*   No results for input(s): "LABPT", "INR" in the last 72 hours.  Neurologically intact No results found.  Assessment/Plan: 2 Days Post-Op Procedure(s) (LRB): LEFT TOTAL KNEE ARTHROPLASTY (Left) Up with therapy, d/c home after PT . Dressing changed incision looks good.   Eldred Manges 04/27/2023, 7:56 AM

## 2023-04-27 NOTE — Progress Notes (Signed)
Physical Therapy Treatment Patient Details Name: Joanna Castro MRN: [id number] DOB: Mar 23, 1946 Today's Date: 04/27/2023   History of Present Illness 77 y.o. female presents to [facility] hospital on 04/25/2023 for elective L TKA. PMH includes fibromyalgia, glaucoma, HLD, HTN, scoliosis, OSA.    PT Comments    Continuing work on functional mobility and activity tolerance;  Pain limited Greta's independence with sit to stand from low bed today, as well as her amb distance; We took our time to allow her to problem-solve through hand placement and bil LE positioning for sit to stand from the bed; ultimately after about 15 minutes of positioning trial and error, we opted to stand from the low bed with light mod assist from this PT to rise and steady; We discussed managing at home -- she will have adult children assist initially at home, so getting up from her bed without physical assist isn't absolutely critical to her getting home today; She also mentioned sleeping in her recliner initially, which is a viable option because going sit>stand with armrests is much easier for [place] (she promises NOT to use the lift feature of her recliner);   Even in considerable pain this morning, Acquanetta is able to walk into and out of the bathroom (and rise from the 3in1 with armrests) slowly, and with simply supervision assist; Her L knee has been stable in stance phase of gait consistently here acutely postop;   We discussed getting into her son's tall SUV, and this PT demonstrated the technique of stepping in backwards with the stronger RLE, and stepping out of the car leading with the LLE so that stronger RLE can control descent to the ground; She politely declined practicing the step (still quite painful)   Recommendations for follow up therapy are one component of a multi-disciplinary discharge planning process, led by the attending physician.  Recommendations may be updated based on patient status, additional functional  criteria and insurance authorization.  Follow Up Recommendations       Assistance Recommended at Discharge PRN  Patient can return home with the following A little help with bathing/dressing/bathroom;Assistance with cooking/housework;Assist for transportation;Help with stairs or ramp for entrance   Equipment Recommendations  None recommended by PT    Recommendations for Other Services       Precautions / Restrictions Precautions Precautions: Fall;Knee Precaution Booklet Issued: Yes (comment) Restrictions LLE Weight Bearing: Weight bearing as tolerated     Mobility  Bed Mobility Overal bed mobility: Needs Assistance Bed Mobility: Supine to Sit     Supine to sit: Supervision     General bed mobility comments: Supervision and occasional cues for more efficient movement; bed flat to approximate home    Transfers Overall transfer level: Needs assistance Equipment used: Rolling walker (2 wheels) Transfers: Sit to/from Stand Sit to Stand: Mod assist           General transfer comment: Mod assist from low bed; very painful and difficulty with rise; incr tome to problem-solve through hand placement    Ambulation/Gait Ambulation/Gait assistance: Supervision Gait Distance (Feet): 20 Feet (10x2) Assistive device: Rolling walker (2 wheels) Gait Pattern/deviations: Step-through pattern, Decreased stride length       General Gait Details: Cues and monitor for stable L knee in stance; no buckling, folding, or sinking into flexion; initially with difficulty clearing L foot for advancement, improved with practice; tends to stop walking when she talks; Pain limited distance this session   Stairs  Wheelchair Mobility    Modified Rankin (Stroke Patients Only)       Balance     Sitting balance-Leahy Scale: Good       Standing balance-Leahy Scale: Fair                              Cognition Arousal/Alertness: Awake/alert Behavior  During Therapy: WFL for tasks assessed/performed Overall Cognitive Status: Within Functional Limits for tasks assessed                                          Exercises      General Comments General comments (skin integrity, edema, etc.): We discussed ways to be successful moving at home, even when pain is very high      Pertinent Vitals/Pain Pain Assessment Pain Assessment: Faces Faces Pain Scale: Hurts whole lot Pain Location: L knee Pain Descriptors / Indicators: Aching, Tender, Throbbing Pain Intervention(s): Monitored during session    Home Living                          Prior Function            PT Goals (current goals can now be found in the care plan section) Acute Rehab PT Goals Patient Stated Goal: to return to independence PT Goal Formulation: With patient Time For Goal Achievement: 04/29/23 Potential to Achieve Goals: Good Progress towards PT goals: Progressing toward goals    Frequency    7X/week      PT Plan Current plan remains appropriate    Co-evaluation              AM-PAC PT "6 Clicks" Mobility   Outcome Measure  Help needed turning from your back to your side while in a flat bed without using bedrails?: None Help needed moving from lying on your back to sitting on the side of a flat bed without using bedrails?: None Help needed moving to and from a bed to a chair (including a wheelchair)?: A Lot Help needed standing up from a chair using your arms (e.g., wheelchair or bedside chair)?: A Lot Help needed to walk in hospital room?: A Little Help needed climbing 3-5 steps with a railing? : A Little 6 Click Score: 18    End of Session Equipment Utilized During Treatment: Gait belt Activity Tolerance: Patient tolerated treatment well Patient left: in chair;with call bell/phone within reach Nurse Communication: Mobility status PT Visit Diagnosis: Other abnormalities of gait and mobility (R26.89);Muscle  weakness (generalized) (M62.81);Pain Pain - Right/Left: Left Pain - part of body: Knee     Time: 0910-0946 PT Time Calculation (min) (ACUTE ONLY): 36 min  Charges:  $Gait Training: 8-22 mins $Therapeutic Activity: 8-22 mins                     Van Clines, PT  Acute Rehabilitation Services Office [phone] Secure Chat welcomed    Levi Aland 04/27/2023, 11:18 AM

## 2023-04-27 NOTE — Plan of Care (Signed)
Discharging to home

## 2023-04-27 NOTE — Progress Notes (Signed)
Discharge summary (AVS) provided to pt with instructions.Pt verbalized understanding of instructions. No complaints voiced. Pt d/c to home as ordered. She remains alert/oriented in no apparent distress. Pt's son is responsible for pt's transport.

## 2023-04-29 ENCOUNTER — Telehealth: Payer: Self-pay | Admitting: Orthopaedic Surgery

## 2023-04-29 NOTE — Telephone Encounter (Signed)
I called and gave verbal orders. 

## 2023-04-29 NOTE — Telephone Encounter (Signed)
Lora from [facility] called. She would like verbal orders for PT 1wk 1x, 2wk 2x 3wk 1x,. Her cb # [phone]

## 2023-05-03 ENCOUNTER — Encounter: Payer: Self-pay | Admitting: Orthopaedic Surgery

## 2023-05-03 ENCOUNTER — Ambulatory Visit (INDEPENDENT_AMBULATORY_CARE_PROVIDER_SITE_OTHER): Payer: Medicare HMO | Admitting: Orthopaedic Surgery

## 2023-05-03 ENCOUNTER — Other Ambulatory Visit: Payer: Self-pay

## 2023-05-03 ENCOUNTER — Telehealth: Payer: Self-pay | Admitting: Orthopaedic Surgery

## 2023-05-03 VITALS — BP 146/82 | HR 88 | Ht 65.0 in | Wt 200.0 lb

## 2023-05-03 DIAGNOSIS — Z96652 Presence of left artificial knee joint: Secondary | ICD-10-CM

## 2023-05-03 NOTE — Telephone Encounter (Signed)
Pt was advised to Return in about 1 week (around 05/10/2023). Please advise

## 2023-05-03 NOTE — Progress Notes (Signed)
   Post-Op Visit Note   Patient: Joanna Castro           Date of Birth: [birth year]           MRN: [id number] Visit Date: 05/03/2023 PCP: Charlette Caffey, MD   Assessment & Plan: Post left total knee arthroplasty.  Dressing change continue home health PT as she did for her opposite knee she lives out in a significant role area in [place].  Return 1 week for staple removal.  Chief Complaint:  Chief Complaint  Patient presents with   Left Knee - Routine Post Op    04/25/2023 Left TKA   Visit Diagnoses:  1. S/P total knee arthroplasty, left     Plan: Patient is mostly just taking Tylenol return in 1 week for staple removal continue home health PT for left total knee arthroplasty.  Follow-Up Instructions: No follow-ups on file.   Orders:  Orders Placed This Encounter  Procedures   XR Knee 1-2 Views Left   No orders of the defined types were placed in this encounter.   Imaging: No results found.  PMFS History: Patient Active Problem List   Diagnosis Date Noted   S/P total knee arthroplasty, left 04/25/2023   Unilateral primary osteoarthritis, left knee 03/12/2023   S/P lumbar fusion 07/18/2017   Postoperative urinary retention 06/03/2017   Lumbar stenosis with neurogenic claudication 05/30/2017   Past Medical History:  Diagnosis Date   Arthritis    Cancer [facility])    pt states she has had several "melanomas and basal cell carcinoma spots cut out"   Fibromyalgia    Glaucoma    High cholesterol    History of blood transfusion    Hypertension    Kidney cysts    effecting creat.   Plantar fasciitis    Scoliosis    Sleep apnea    intermittently uses cpap ( started 2 weeks ago)   Spinal stenosis     No family history on file.  Past Surgical History:  Procedure Laterality Date   ABDOMINAL HYSTERECTOMY     ANKLE FRACTURE SURGERY Right 2012   BACK SURGERY     x3 (2008. 2012, and 2018)   BREAST LUMPECTOMY Left 2021   CATARACT EXTRACTION Bilateral     and "raised eye lids"   CERV[facility] SP[facility] SURGERY  06/[facility]   CHOLECYSTECTOMY     EYE SURGERY Bilateral    placed duct tubes   FINGER SURGERY     left 4th digit (pt states they had to put a rod in)   KNEE ARTHROPLASTY Right 04/29/2014   Procedure: COMPUTER ASSISTED TOTAL KNEE ARTHROPLASTY;  Surgeon: Eldred Manges, MD;  Location: [facility] OR;  Service: Orthopedics;  Laterality: Right;  Cemented Right Total Knee Arthroplasty   TONSILLECTOMY     removed as a child   TOTAL KNEE ARTHROPLASTY Left 04/25/2023   Procedure: LEFT TOTAL KNEE ARTHROPLASTY;  Surgeon: Eldred Manges, MD;  Location: [facility] OR;  Service: Orthopedics;  Laterality: Left;  Needs RNFA   TUBAL LIGATION     Social History   Occupational History   Not on file  Tobacco Use   Smoking status: Never   Smokeless tobacco: Never  Vaping Use   Vaping Use: Never used  Substance and Sexual Activity   Alcohol use: Not Currently   Drug use: No   Sexual activity: Not on file

## 2023-05-05 NOTE — Telephone Encounter (Signed)
I scheduled for Monday 05/09/2023 at 1pm.  Left voicemail for patient advising.

## 2023-05-09 ENCOUNTER — Ambulatory Visit (INDEPENDENT_AMBULATORY_CARE_PROVIDER_SITE_OTHER): Payer: Medicare HMO | Admitting: Orthopaedic Surgery

## 2023-05-09 ENCOUNTER — Encounter: Payer: Self-pay | Admitting: Orthopaedic Surgery

## 2023-05-09 VITALS — BP 130/80 | HR 101 | Temp 97.7°F | Ht 65.0 in | Wt 200.0 lb

## 2023-05-09 DIAGNOSIS — Z96652 Presence of left artificial knee joint: Secondary | ICD-10-CM

## 2023-05-09 NOTE — Progress Notes (Signed)
   Post-Op Visit Note   Patient: Joanna Castro           Date of Birth: 1946/10/15           MRN: [id number] Visit Date: 05/09/2023 PCP: Charlette Caffey, MD   Assessment & Plan: Okay post total knee arthroplasty.  Incision looks good staples removed.  She mention she has had some low-grade temperature temperature today is 97.7.  She is just using plain Tylenol and likes to avoid narcotics if possible.  Previous x-rays look good.  She lacks about 4 degrees reaching full extension and is making progress with her flexion.  Chief Complaint:  Chief Complaint  Patient presents with   Left Knee - Routine Post Op, Follow-up    04/25/2023 Left TKA   Visit Diagnoses: No diagnosis found.  Plan: Continue home therapy.  She has transportation problems from her outpatient therapy.  Recheck 5 weeks.  Follow-Up Instructions: No follow-ups on file.   Orders:  No orders of the defined types were placed in this encounter.  No orders of the defined types were placed in this encounter.   Imaging: No results found.  PMFS History: Patient Active Problem List   Diagnosis Date Noted   S/P total knee arthroplasty, left 04/25/2023   Unilateral primary osteoarthritis, left knee 03/12/2023   S/P lumbar fusion 07/18/2017   Postoperative urinary retention 06/03/2017   Lumbar stenosis with neurogenic claudication 05/30/2017   Past Medical History:  Diagnosis Date   Arthritis    Cancer [facility])    pt states she has had several "melanomas and basal cell carcinoma spots cut out"   Fibromyalgia    Glaucoma    High cholesterol    History of blood transfusion    Hypertension    Kidney cysts    effecting creat.   Plantar fasciitis    Scoliosis    Sleep apnea    intermittently uses cpap ( started 2 weeks ago)   Spinal stenosis     No family history on file.  Past Surgical History:  Procedure Laterality Date   ABDOMINAL HYSTERECTOMY     ANKLE FRACTURE SURGERY Right 2012   BACK SURGERY      x3 (2008. 2012, and 2018)   BREAST LUMPECTOMY Left 2021   CATARACT EXTRACTION Bilateral    and "raised eye lids"   [facility] SPINE SURGERY  06/2016   [facility]   CHOLECYSTECTOMY     EYE SURGERY Bilateral    placed duct tubes   FINGER SURGERY     left 4th digit (pt states they had to put a rod in)   KNEE ARTHROPLASTY Right 04/29/2014   Procedure: COMPUTER ASSISTED TOTAL KNEE ARTHROPLASTY;  Surgeon: Eldred Manges, MD;  Location: [facility] OR;  Service: Orthopedics;  Laterality: Right;  Cemented Right Total Knee Arthroplasty   TONSILLECTOMY     removed as a child   TOTAL KNEE ARTHROPLASTY Left 04/25/2023   Procedure: LEFT TOTAL KNEE ARTHROPLASTY;  Surgeon: Eldred Manges, MD;  Location: [facility] OR;  Service: Orthopedics;  Laterality: Left;  Needs RNFA   TUBAL LIGATION     Social History   Occupational History   Not on file  Tobacco Use   Smoking status: Never   Smokeless tobacco: Never  Vaping Use   Vaping Use: Never used  Substance and Sexual Activity   Alcohol use: Not Currently   Drug use: No   Sexual activity: Not on file

## 2023-06-07 ENCOUNTER — Ambulatory Visit (INDEPENDENT_AMBULATORY_CARE_PROVIDER_SITE_OTHER): Payer: Medicare HMO | Admitting: Orthopaedic Surgery

## 2023-06-07 DIAGNOSIS — Z96652 Presence of left artificial knee joint: Secondary | ICD-10-CM

## 2023-06-08 NOTE — Progress Notes (Signed)
   Post-Op Visit Note   Patient: Joanna Castro           Date of Birth: 1946-07-15           MRN: [id number] Visit Date: 06/07/2023 PCP: Charlette Caffey, MD   Assessment & Plan: Follow-up total knee arthroplasty left.  Patient is ambulating with a cane she walks in the house without the cane.  She stopped physical therapy and is doing exercises on her own with progressive improvement still has slight quad weakness full extension flexes to 110 degrees.  Chief Complaint:  Chief Complaint  Patient presents with   Left Knee - Routine Post Op    04/25/23 left TKA   Visit Diagnoses:  1. S/P total knee arthroplasty, left     Plan: Continue quad strengthening, flexion and ambulation.  She is very happy with results of surgery.  Follow-Up Instructions: No follow-ups on file.   Orders:  No orders of the defined types were placed in this encounter.  No orders of the defined types were placed in this encounter.   Imaging: No results found.  PMFS History: Patient Active Problem List   Diagnosis Date Noted   S/P total knee arthroplasty, left 04/25/2023   Unilateral primary osteoarthritis, left knee 03/12/2023   S/P lumbar fusion 07/18/2017   Postoperative urinary retention 06/03/2017   Lumbar stenosis with neurogenic claudication 05/30/2017   Past Medical History:  Diagnosis Date   Arthritis    Cancer [facility])    pt states she has had several "melanomas and basal cell carcinoma spots cut out"   Fibromyalgia    Glaucoma    High cholesterol    History of blood transfusion    Hypertension    Kidney cysts    effecting creat.   Plantar fasciitis    Scoliosis    Sleep apnea    intermittently uses cpap ( started 2 weeks ago)   Spinal stenosis     No family history on file.  Past Surgical History:  Procedure Laterality Date   ABDOMINAL HYSTERECTOMY     ANKLE FRACTURE SURGERY Right 2012   BACK SURGERY     x3 (2008. 2012, and 2018)   BREAST LUMPECTOMY Left 2021    CATARACT EXTRACTION Bilateral    and "raised eye lids"   [facility] SP[facility] SURGERY  [facility]   CHOLECYSTECTOMY     EYE SURGERY Bilateral    placed duct tubes   FINGER SURGERY     left 4th digit (pt states they had to put a rod in)   KNEE ARTHROPLASTY Right 04/29/2014   Procedure: COMPUTER ASSISTED TOTAL KNEE ARTHROPLASTY;  Surgeon: Eldred Manges, MD;  Location: [facility] OR;  Service: Orthopedics;  Laterality: Right;  Cemented Right Total Knee Arthroplasty   TONSILLECTOMY     removed as a child   TOTAL KNEE ARTHROPLASTY Left 04/25/2023   Procedure: LEFT TOTAL KNEE ARTHROPLASTY;  Surgeon: Eldred Manges, MD;  Location: [facility] OR;  Service: Orthopedics;  Laterality: Left;  Needs RNFA   TUBAL LIGATION     Social History   Occupational History   Not on file  Tobacco Use   Smoking status: Never   Smokeless tobacco: Never  Vaping Use   Vaping status: Never Used  Substance and Sexual Activity   Alcohol use: Not Currently   Drug use: No   Sexual activity: Not on file

## 2023-12-26 ENCOUNTER — Other Ambulatory Visit (HOSPITAL_COMMUNITY): Payer: Self-pay

## 2023-12-26 ENCOUNTER — Other Ambulatory Visit: Payer: Self-pay

## 2023-12-26 MED ORDER — GABAPENTIN 300 MG PO CAPS
600.0000 mg | ORAL_CAPSULE | Freq: Four times a day (QID) | ORAL | 0 refills | Status: AC
  Filled 2023-12-26 – 2023-12-27 (×2): qty 400, 50d supply, fill #0

## 2023-12-26 MED ORDER — AMLODIPINE BESYLATE 10 MG PO TABS
10.0000 mg | ORAL_TABLET | Freq: Every day | ORAL | 2 refills | Status: AC
  Filled 2023-12-26 (×2): qty 100, 100d supply, fill #0

## 2023-12-26 MED ORDER — FUROSEMIDE 20 MG PO TABS
20.0000 mg | ORAL_TABLET | Freq: Every day | ORAL | 0 refills | Status: AC | PRN
  Filled 2023-12-26 – 2023-12-27 (×2): qty 100, 100d supply, fill #0

## 2023-12-26 MED ORDER — POTASSIUM CHLORIDE ER 20 MEQ PO TBCR
1.0000 | EXTENDED_RELEASE_TABLET | Freq: Every day | ORAL | 0 refills | Status: AC
  Filled 2023-12-26 – 2023-12-27 (×2): qty 100, 100d supply, fill #0

## 2023-12-26 MED ORDER — DICLOFENAC SODIUM 50 MG PO TBEC
50.0000 mg | DELAYED_RELEASE_TABLET | Freq: Every day | ORAL | 0 refills | Status: AC
  Filled 2023-12-26 – 2023-12-27 (×2): qty 100, 100d supply, fill #0

## 2023-12-26 MED ORDER — PRAVASTATIN SODIUM 40 MG PO TABS
40.0000 mg | ORAL_TABLET | Freq: Every day | ORAL | 0 refills | Status: AC
  Filled 2023-12-26 – 2023-12-27 (×2): qty 100, 100d supply, fill #0

## 2023-12-26 MED ORDER — ALLOPURINOL 100 MG PO TABS
100.0000 mg | ORAL_TABLET | Freq: Two times a day (BID) | ORAL | 0 refills | Status: AC
  Filled 2023-12-26 – 2023-12-27 (×2): qty 200, 100d supply, fill #0

## 2023-12-27 ENCOUNTER — Other Ambulatory Visit (HOSPITAL_COMMUNITY): Payer: Self-pay

## 2023-12-27 ENCOUNTER — Other Ambulatory Visit: Payer: Self-pay

## 2024-04-06 ENCOUNTER — Other Ambulatory Visit: Payer: Self-pay

## 2024-04-06 ENCOUNTER — Other Ambulatory Visit (HOSPITAL_COMMUNITY): Payer: Self-pay

## 2024-04-06 MED ORDER — FUROSEMIDE 20 MG PO TABS
ORAL_TABLET | ORAL | 0 refills | Status: AC
  Filled 2024-04-06: qty 100, 100d supply, fill #0

## 2024-04-30 ENCOUNTER — Other Ambulatory Visit (HOSPITAL_COMMUNITY): Payer: Self-pay

## 2024-04-30 ENCOUNTER — Other Ambulatory Visit: Payer: Self-pay

## 2024-04-30 MED ORDER — ALLOPURINOL 100 MG PO TABS
100.0000 mg | ORAL_TABLET | Freq: Two times a day (BID) | ORAL | 0 refills | Status: AC
  Filled 2024-04-30: qty 200, 100d supply, fill #0

## 2024-04-30 MED ORDER — POTASSIUM CHLORIDE ER 20 MEQ PO TBCR
1.0000 | EXTENDED_RELEASE_TABLET | Freq: Every day | ORAL | 0 refills | Status: AC
  Filled 2024-04-30: qty 100, 100d supply, fill #0

## 2024-04-30 MED ORDER — GABAPENTIN 300 MG PO CAPS
600.0000 mg | ORAL_CAPSULE | Freq: Two times a day (BID) | ORAL | 1 refills | Status: AC
  Filled 2024-04-30: qty 400, 100d supply, fill #0

## 2024-04-30 MED ORDER — DICLOFENAC SODIUM 50 MG PO TBEC
50.0000 mg | DELAYED_RELEASE_TABLET | Freq: Every day | ORAL | 0 refills | Status: AC
  Filled 2024-04-30: qty 100, 100d supply, fill #0

## 2024-04-30 MED ORDER — AMLODIPINE BESYLATE 10 MG PO TABS
10.0000 mg | ORAL_TABLET | Freq: Every day | ORAL | 2 refills | Status: AC
  Filled 2024-04-30: qty 100, 100d supply, fill #0

## 2024-04-30 MED ORDER — PRAVASTATIN SODIUM 40 MG PO TABS
40.0000 mg | ORAL_TABLET | Freq: Every day | ORAL | 0 refills | Status: AC
  Filled 2024-04-30: qty 100, 100d supply, fill #0

## 2024-04-30 MED ORDER — FUROSEMIDE 20 MG PO TABS
20.0000 mg | ORAL_TABLET | Freq: Every day | ORAL | 0 refills | Status: AC | PRN
  Filled 2024-04-30: qty 100, 100d supply, fill #0

## 2024-07-04 ENCOUNTER — Other Ambulatory Visit (HOSPITAL_COMMUNITY): Payer: Self-pay

## 2024-07-04 MED ORDER — FUROSEMIDE 20 MG PO TABS
20.0000 mg | ORAL_TABLET | Freq: Every day | ORAL | 0 refills | Status: AC | PRN
  Filled 2024-07-04: qty 100, 100d supply, fill #0

## 2024-10-09 ENCOUNTER — Other Ambulatory Visit: Payer: Self-pay

## 2024-10-09 ENCOUNTER — Other Ambulatory Visit (HOSPITAL_COMMUNITY): Payer: Self-pay

## 2024-10-09 MED ORDER — GABAPENTIN 300 MG PO CAPS
600.0000 mg | ORAL_CAPSULE | Freq: Two times a day (BID) | ORAL | 0 refills | Status: AC
  Filled 2024-10-09: qty 400, 100d supply, fill #0

## 2024-10-09 MED ORDER — ALLOPURINOL 100 MG PO TABS
100.0000 mg | ORAL_TABLET | Freq: Two times a day (BID) | ORAL | 0 refills | Status: AC
  Filled 2024-10-09: qty 200, 100d supply, fill #0

## 2024-10-09 MED ORDER — AMLODIPINE BESYLATE 10 MG PO TABS
10.0000 mg | ORAL_TABLET | Freq: Every day | ORAL | 2 refills | Status: AC
  Filled 2024-10-09: qty 100, 100d supply, fill #0
